# Patient Record
Sex: Male | Born: 1955
Health system: Southern US, Community
[De-identification: ages and names within clinical notes are randomized; demographics above are authoritative.]

## PROBLEM LIST (undated history)

## (undated) DIAGNOSIS — E669 Obesity, unspecified: Secondary | ICD-10-CM

## (undated) DIAGNOSIS — E785 Hyperlipidemia, unspecified: Secondary | ICD-10-CM

## (undated) DIAGNOSIS — Z8639 Personal history of other endocrine, nutritional and metabolic disease: Secondary | ICD-10-CM

## (undated) DIAGNOSIS — E119 Type 2 diabetes mellitus without complications: Secondary | ICD-10-CM

## (undated) DIAGNOSIS — F419 Anxiety disorder, unspecified: Secondary | ICD-10-CM

## (undated) DIAGNOSIS — F329 Major depressive disorder, single episode, unspecified: Secondary | ICD-10-CM

## (undated) DIAGNOSIS — G8929 Other chronic pain: Secondary | ICD-10-CM

## (undated) DIAGNOSIS — M542 Cervicalgia: Secondary | ICD-10-CM

## (undated) DIAGNOSIS — R739 Hyperglycemia, unspecified: Secondary | ICD-10-CM

## (undated) DIAGNOSIS — R51 Headache: Secondary | ICD-10-CM

## (undated) DIAGNOSIS — Z720 Tobacco use: Secondary | ICD-10-CM

## (undated) DIAGNOSIS — R0683 Snoring: Secondary | ICD-10-CM

## (undated) DIAGNOSIS — F32A Depression, unspecified: Secondary | ICD-10-CM

## (undated) DIAGNOSIS — I2510 Atherosclerotic heart disease of native coronary artery without angina pectoris: Secondary | ICD-10-CM

## (undated) DIAGNOSIS — I10 Essential (primary) hypertension: Secondary | ICD-10-CM

## (undated) DIAGNOSIS — R519 Headache, unspecified: Secondary | ICD-10-CM

## (undated) DIAGNOSIS — G4733 Obstructive sleep apnea (adult) (pediatric): Secondary | ICD-10-CM

## (undated) HISTORY — PX: CERVICAL SPINE SURGERY: SHX589

## (undated) HISTORY — DX: Hyperlipidemia, unspecified: E78.5

## (undated) HISTORY — DX: Headache: R51

## (undated) HISTORY — DX: Major depressive disorder, single episode, unspecified: F32.9

## (undated) HISTORY — DX: Snoring: R06.83

## (undated) HISTORY — DX: Personal history of other endocrine, nutritional and metabolic disease: Z86.39

## (undated) HISTORY — DX: Other chronic pain: G89.29

## (undated) HISTORY — DX: Hyperglycemia, unspecified: R73.9

## (undated) HISTORY — DX: Headache, unspecified: R51.9

## (undated) HISTORY — DX: Anxiety disorder, unspecified: F41.9

## (undated) HISTORY — DX: Tobacco use: Z72.0

## (undated) HISTORY — DX: Obstructive sleep apnea (adult) (pediatric): G47.33

## (undated) HISTORY — DX: Obesity, unspecified: E66.9

## (undated) HISTORY — DX: Depression, unspecified: F32.A

---
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
## 2004-05-13 HISTORY — PX: SHOULDER ARTHROSCOPY: SHX128

## 2005-05-13 HISTORY — PX: CERVICAL SPINE SURGERY: SHX589

## 2012-03-10 ENCOUNTER — Emergency Department (HOSPITAL_COMMUNITY)
Admission: EM | Admit: 2012-03-10 | Discharge: 2012-03-10 | Disposition: A | Discharge status: Home / Self Care | Payer: Worker's Compensation | Attending: Emergency Medicine | Admitting: Emergency Medicine

## 2012-03-10 ENCOUNTER — Emergency Department (HOSPITAL_COMMUNITY): Payer: Worker's Compensation

## 2012-03-10 ENCOUNTER — Encounter (HOSPITAL_COMMUNITY): Payer: Self-pay | Admitting: *Deleted

## 2012-03-10 DIAGNOSIS — T50995A Adverse effect of other drugs, medicaments and biological substances, initial encounter: Secondary | ICD-10-CM | POA: Insufficient documentation

## 2012-03-10 DIAGNOSIS — M542 Cervicalgia: Secondary | ICD-10-CM | POA: Insufficient documentation

## 2012-03-10 DIAGNOSIS — I499 Cardiac arrhythmia, unspecified: Secondary | ICD-10-CM | POA: Insufficient documentation

## 2012-03-10 DIAGNOSIS — F17200 Nicotine dependence, unspecified, uncomplicated: Secondary | ICD-10-CM | POA: Insufficient documentation

## 2012-03-10 DIAGNOSIS — Z79899 Other long term (current) drug therapy: Secondary | ICD-10-CM | POA: Insufficient documentation

## 2012-03-10 DIAGNOSIS — I10 Essential (primary) hypertension: Secondary | ICD-10-CM | POA: Insufficient documentation

## 2012-03-10 DIAGNOSIS — G8929 Other chronic pain: Secondary | ICD-10-CM | POA: Insufficient documentation

## 2012-03-10 DIAGNOSIS — T40605A Adverse effect of unspecified narcotics, initial encounter: Secondary | ICD-10-CM | POA: Insufficient documentation

## 2012-03-10 DIAGNOSIS — T50905A Adverse effect of unspecified drugs, medicaments and biological substances, initial encounter: Secondary | ICD-10-CM

## 2012-03-10 HISTORY — DX: Other chronic pain: G89.29

## 2012-03-10 HISTORY — DX: Essential (primary) hypertension: I10

## 2012-03-10 HISTORY — DX: Cervicalgia: M54.2

## 2012-03-10 LAB — CBC WITH DIFFERENTIAL/PLATELET
Basophils Absolute: 0.1 10*3/uL (ref 0.0–0.1)
Basophils Relative: 0 % (ref 0–1)
Eosinophils Absolute: 0.4 10*3/uL (ref 0.0–0.7)
HCT: 44 % (ref 39.0–52.0)
MCH: 30.3 pg (ref 26.0–34.0)
MCHC: 35.5 g/dL (ref 30.0–36.0)
Monocytes Absolute: 0.6 10*3/uL (ref 0.1–1.0)
Neutro Abs: 7 10*3/uL (ref 1.7–7.7)
Neutrophils Relative %: 61 % (ref 43–77)
RDW: 11.8 % (ref 11.5–15.5)

## 2012-03-10 LAB — POCT I-STAT, CHEM 8
Calcium, Ion: 1.16 mmol/L (ref 1.12–1.23)
Chloride: 101 mEq/L (ref 96–112)
Glucose, Bld: 92 mg/dL (ref 70–99)
HCT: 47 % (ref 39.0–52.0)
Hemoglobin: 16 g/dL (ref 13.0–17.0)

## 2012-03-10 MED ORDER — LORAZEPAM 1 MG PO TABS: 1.0000 mg | ORAL_TABLET | Freq: Three times a day (TID) | ORAL | Status: DC | PRN

## 2012-03-10 MED ORDER — HYDROMORPHONE HCL PF 2 MG/ML IJ SOLN
2.0000 mg | Freq: Once | INTRAMUSCULAR | Status: AC
  Administered 2012-03-10: 2 mg via INTRAMUSCULAR
  Filled 2012-03-10: qty 1

## 2012-03-10 MED ORDER — OXYCODONE HCL 5 MG PO TABS
20.0000 mg | ORAL_TABLET | Freq: Once | ORAL | Status: AC
  Administered 2012-03-10: 20 mg via ORAL
  Filled 2012-03-10: qty 4

## 2012-03-10 MED ORDER — LORAZEPAM 1 MG PO TABS
1.0000 mg | ORAL_TABLET | Freq: Once | ORAL | Status: AC
  Administered 2012-03-10: 1 mg via ORAL
  Filled 2012-03-10: qty 1

## 2012-03-10 NOTE — ED Notes (Signed)
Pt ambulated with cane upon discharge. Pt leaving with wife and instructed not to drive. Pt has no further questions after d/c teaching and give prescriptions. Pt does not appear to be in any acute distress.

## 2012-03-10 NOTE — ED Notes (Signed)
Pt returned from radiology; placed back on monitor

## 2012-03-10 NOTE — ED Provider Notes (Addendum)
History     CSN: [id number]  Arrival date & time 03/10/12  1314   First MD Initiated Contact with Patient 03/10/12 1356      Chief Complaint  Patient presents with  . Headache  . Irregular Heart Beat    (Consider location/radiation/quality/duration/timing/severity/associated sxs/prior treatment) HPI Comments: Tatian symptoms all started when he stopped his oxycodone and started Opana  Patient is a 56 y.o. male presenting with headaches. The history is provided by the patient.  Headache  This is a new problem. Episode onset: One week ago. The problem occurs constantly. The problem has been gradually worsening. The headache is associated with nothing. The pain is located in the bilateral region. The quality of the pain is described as throbbing and sharp. The pain is at a severity of 10/10. The pain is severe. The pain radiates to the left neck and right neck. Associated symptoms include palpitations and shortness of breath. Pertinent negatives include no nausea and no vomiting. Associated symptoms comments: Anxious, chills. He has tried nothing for the symptoms. The treatment provided no relief.    Past Medical History  Diagnosis Date  . Hypertension   . Chronic neck pain     Past Surgical History  Procedure Date  . Cervical spine surgery   . Shoulder arthroscopy     right    No family history on file.  History  Substance Use Topics  . Smoking status: Current Every Day Smoker    Types: Cigarettes  . Smokeless tobacco: Not on file  . Alcohol Use: No      Review of Systems  Respiratory: Positive for shortness of breath.   Cardiovascular: Positive for palpitations.  Gastrointestinal: Negative for nausea and vomiting.  Neurological: Positive for headaches.  All other systems reviewed and are negative.    Allergies  Oxymorphone  Home Medications   Current Outpatient Rx  Name Route Sig Dispense Refill  . CARISOPRODOL 350 MG PO TABS Oral Take 350 mg by mouth 3  (three) times daily as needed. For pain    . LISINOPRIL 20 MG PO TABS Oral Take 20 mg by mouth daily.    Marland Kitchen OXYMORPHONE HCL ER 40 MG PO TB12 Oral Take 40 mg by mouth every 12 (twelve) hours.    . OXYMORPHONE HCL 5 MG PO TABS Oral Take 5 mg by mouth every 4 (four) hours as needed. For pain      BP 167/97  Pulse 84  Temp 98.2 F (36.8 C) (Oral)  Resp 20  SpO2 97%  Physical Exam  Nursing note and vitals reviewed. Constitutional: He is oriented to person, place, and time. He appears well-developed and well-nourished. He appears distressed.  HENT:  Head: Normocephalic and atraumatic.  Mouth/Throat: Oropharynx is clear and moist.  Eyes: Conjunctivae normal and EOM are normal. Pupils are equal, round, and reactive to light.  Neck: Neck supple.       Patient is a soft neck brace with well-healed surgical scars of the neck. He has decreased range of motion of his neck  Cardiovascular: Normal rate, regular rhythm and intact distal pulses.   No murmur heard. Pulmonary/Chest: Effort normal and breath sounds normal. No respiratory distress. He has no wheezes. He has no rales.  Abdominal: Soft. He exhibits no distension. There is no tenderness. There is no rebound and no guarding.  Musculoskeletal: Normal range of motion. He exhibits no edema and no tenderness.  Neurological: He is alert and oriented to person, place, and time.  Skin:  Skin is warm and dry. No rash noted. No erythema.  Psychiatric: His behavior is normal. His mood appears anxious.    ED Course  Procedures (including critical care time)  Labs Reviewed  CBC WITH DIFFERENTIAL - Abnormal; Notable for the following:    WBC 11.4 (*)     All other components within normal limits  POCT I-STAT, CHEM 8   Dg Chest 2 View  03/10/2012   *RADIOLOGY REPORT*  Clinical Data: Palpitations today, some shortness of breath  CHEST - 2 VIEW  Comparison: None.  Findings: No pneumonia is seen.  Minimal scarring or atelectasis is noted at the lung  base on the lateral view possibly the left lung base.  There is mild peribronchial thickening present.  The heart is mildly enlarged. There are degenerative changes throughout the thoracic spine.  IMPRESSION: Mild cardiomegaly.  Mild peribronchial thickening.  Linear atelectasis or scarring at the lung base, possibly the left lung base.   Original Report Authenticated By: Juline Patch, M.D.      Date: 03/10/2012  Rate: 83  Rhythm: normal sinus rhythm  QRS Axis: normal  Intervals: normal  ST/T Wave abnormalities: normal  Conduction Disutrbances: none  Narrative Interpretation: unremarkable      1. Adverse drug reaction       MDM   Patient with a history of chronic pain who has been taking Opana 24 hour release and oxycodone for a prolonged period of time and then last week was changed to Opana 5 mg tablets every 4 hours when necessary instead of oxycodone. Since that time for the last week he started having dizziness, lightheadedness, palpitations, anxiety, headache, severe pain. He attempted to get a hold of his pain management doctor and has not heard any word from her.  He denies fevers however appears very distressed on exam. He satting normally with a mild elevation in his blood pressure but no evidence of tachycardia. Heal this is most likely an adverse reaction from the Opana. Patient was given IM Dilaudid and Ativan to help with his symptoms. Chest x-ray, EKG, CBC, i-STAT pending to rule out other causes for his symptoms. He has no cardiac history and his only risk factor is hypertension and smoking however this does not sound cardiac in nature.  3:08 PM No acute findings on labs and CXR.  EKG wnl.  Pt feleing much better.      Gwyneth Sprout, MD 03/10/12 1519  Gwyneth Sprout, MD 03/10/12 1521

## 2012-03-10 NOTE — ED Notes (Signed)
W. Plunkett, MD at bedside.  

## 2012-03-10 NOTE — ED Notes (Signed)
Patient states that his MD changed his medication about a week a go and he thinks he is having adverse reaction.  He was switched from oxycodone for 4 years and now he is taking something else.  Patient c/o palpitating of his chest, headache, neck pain, and states that he just does not feel well.  Respiratory intact.  Patient has a tense unit on for his neck

## 2012-08-18 ENCOUNTER — Ambulatory Visit: Payer: Self-pay | Attending: Physical Medicine and Rehabilitation

## 2012-08-18 DIAGNOSIS — IMO0001 Reserved for inherently not codable concepts without codable children: Secondary | ICD-10-CM | POA: Insufficient documentation

## 2012-08-18 DIAGNOSIS — M6281 Muscle weakness (generalized): Secondary | ICD-10-CM | POA: Insufficient documentation

## 2012-08-18 DIAGNOSIS — M542 Cervicalgia: Secondary | ICD-10-CM | POA: Insufficient documentation

## 2012-08-18 DIAGNOSIS — R5381 Other malaise: Secondary | ICD-10-CM | POA: Insufficient documentation

## 2012-08-18 DIAGNOSIS — M25519 Pain in unspecified shoulder: Secondary | ICD-10-CM | POA: Insufficient documentation

## 2012-08-24 ENCOUNTER — Ambulatory Visit: Payer: Self-pay

## 2012-08-26 ENCOUNTER — Ambulatory Visit: Payer: Self-pay | Admitting: Rehabilitation

## 2012-08-31 ENCOUNTER — Ambulatory Visit: Payer: Self-pay

## 2012-09-02 ENCOUNTER — Ambulatory Visit: Payer: Self-pay

## 2012-09-07 ENCOUNTER — Ambulatory Visit: Payer: Self-pay

## 2012-09-14 ENCOUNTER — Ambulatory Visit: Payer: Self-pay

## 2015-05-11 ENCOUNTER — Encounter: Payer: Self-pay | Admitting: Neurology

## 2015-05-11 ENCOUNTER — Ambulatory Visit (INDEPENDENT_AMBULATORY_CARE_PROVIDER_SITE_OTHER): Payer: Medicare Other | Admitting: Neurology

## 2015-05-11 VITALS — BP 180/92 | HR 82 | Resp 22 | Ht 71.75 in | Wt 262.0 lb

## 2015-05-11 DIAGNOSIS — R51 Headache: Secondary | ICD-10-CM | POA: Diagnosis not present

## 2015-05-11 DIAGNOSIS — R0683 Snoring: Secondary | ICD-10-CM

## 2015-05-11 DIAGNOSIS — E669 Obesity, unspecified: Secondary | ICD-10-CM | POA: Diagnosis not present

## 2015-05-11 DIAGNOSIS — R351 Nocturia: Secondary | ICD-10-CM | POA: Diagnosis not present

## 2015-05-11 DIAGNOSIS — R519 Headache, unspecified: Secondary | ICD-10-CM

## 2015-05-11 DIAGNOSIS — G4719 Other hypersomnia: Secondary | ICD-10-CM | POA: Diagnosis not present

## 2015-05-11 DIAGNOSIS — R0681 Apnea, not elsewhere classified: Secondary | ICD-10-CM

## 2015-05-11 NOTE — Progress Notes (Signed)
Subjective:    Patient ID: Thomas Macdonald is a 59 y.o. male.  HPI     Star Age, MD, PhD [facility] [address], [address] [place], [place] [address]  Dear Dr. Ardeth Perfect,   I saw your patient, Thomas Macdonald, upon your kind request in my neurologic clinic today for initial consultation of his sleep disorder, in particular, concern for underlying obstructive sleep apnea. The patient is accompanied by his wife today. As you know, [name] is a 59 year old right-handed gentleman with an underlying medical history of depression, anxiety, hyperlipidemia, hypertension, recurrent headaches, degenerative cervical spine disease on chronic narcotic pain medication including MS Contin and oxycodone and followed by pain management, smoking and obesity, who reports snoring and excessive daytime somnolence.  He and his wife moved from [facility] some 3 years ago. When he moved from [place], a lot of his medications were reduced. This helped with being more alert during the day. Nevertheless, he is quite sleepy during the day. He falls asleep easily. He has a sedentary lifestyle currently. Some 10 years ago he had an accident and required surgeries to his neck with hardware in place and surgery to his right rotator cuff. His bedtime varies but generally speaking he may be in bed between 11 PM and 11:30 PM. He sleeps till 10 or 11 AM every day. He does not wake up rested. He has nocturia about 3 times each night, sometimes 2 times depending on his water intake. Of note, he does not drink a lot of water and primarily drinks cherry Coke. He smokes, about 1 pack per day and has done so all his life he says. He does not appear to be motivated to quit smoking. His wife adds that his anxiety and depression have become worse. Family history is not known as he is adopted. He snores and has been witnessed to have apneic pauses while asleep. This has been witnessed also by his daughter who is a  Marine scientist. She has suspected that he has underlying OSA. He has gained weight. His blood pressure has been difficult to control. He has morning headaches fairly frequently. He is a restless sleeper. He is a light sleeper as well. Of note, he also takes gabapentin, flexeril and clonazepam, i.e. several potentially sedating medications in addition to his narcotic pain medications. I reviewed your office note from 04/24/2015, which you kindly included.  His Past Medical History Is Significant For: Past Medical History  Diagnosis Date  . Hypertension   . Chronic neck pain   . Snoring   . Headache   . Depression   . Anxiety     His Past Surgical History Is Significant For: Past Surgical History  Procedure Laterality Date  . Cervical spine surgery    . Shoulder arthroscopy      right    His Family History Is Significant For: No family history on file.  His Social History Is Significant For: Social History   Social History  . Marital Status: Married    Spouse Name: N/A  . Number of Children: N/A  . Years of Education: 14   Occupational History  . Disabled     Social History Main Topics  . Smoking status: Current Every Day Smoker    Types: Cigarettes  . Smokeless tobacco: None  . Alcohol Use: No  . Drug Use: No  . Sexual Activity: Not Asked   Other Topics Concern  . None   Social History Narrative  5 cups of soda or coffee a day     His Allergies Are:  Allergies  Allergen Reactions  . Oxymorphone Other (See Comments)    Irregular heartbeat,hotflash  :   His Current Medications Are:  Outpatient Encounter Prescriptions as of 05/11/2015  Medication Sig  . clonazePAM (KLONOPIN) 0.5 MG tablet Take 0.5 mg by mouth 2 (two) times daily as needed for anxiety.  . cyclobenzaprine (FLEXERIL) 10 MG tablet TK 1/2 T PO TID PRF PAIN  . escitalopram (LEXAPRO) 20 MG tablet Take 20 mg by mouth daily.  Marland Kitchen gabapentin (NEURONTIN) 300 MG capsule Take 300 mg by mouth 3 (three) times  daily.  Marland Kitchen lisinopril (PRINIVIL,ZESTRIL) 40 MG tablet Take 40 mg by mouth daily.  . meloxicam (MOBIC) 7.5 MG tablet Take 7.5 mg by mouth daily.  Marland Kitchen morphine (MS CONTIN) 30 MG 12 hr tablet Take 30 mg by mouth every 12 (twelve) hours.  Marland Kitchen oxyCODONE (ROXICODONE) 15 MG immediate release tablet Take 15 mg by mouth every 4 (four) hours as needed for pain.   No facility-administered encounter medications on file as of 05/11/2015.  :  Review of Systems:  Out of a complete 14 point review of systems, all are reviewed and negative with the exception of these symptoms as listed below:   Review of Systems  Constitutional: Positive for fatigue.       Weight gain   HENT: Positive for tinnitus.   Respiratory: Positive for shortness of breath and wheezing.   Cardiovascular: Positive for palpitations.  Neurological: Positive for dizziness, tremors, weakness and headaches.       Wakes up several times in the night, wife reports that patient falls asleep often, snoring, witnessed apnea, wakes up in the morning feeling tired, morning headaches, daytime tiredness.   Psychiatric/Behavioral: Positive for confusion.       Depression, anxiety, decreased energy, disinterest in activities, racing thoughts.   Epworth Sleepiness Scale 0= would never doze 1= slight chance of dozing 2= moderate chance of dozing 3= high chance of dozing  Sitting and reading:3 Watching TV:3 Sitting inactive in a public place (ex. Theater or meeting):2 As a passenger in a car for an hour without a break:1 Lying down to rest in the afternoon:3 Sitting and talking to someone:0 Sitting quietly after lunch (no alcohol):2 In a car, while stopped in traffic:0 Total:14   Objective:  Neurologic Exam  Physical Exam Physical Examination:   Filed Vitals:   05/11/15 1111  BP: 180/92  Pulse: 82  Resp: 22   General Examination: The patient is a very pleasant 59 y.o. male in no acute distress. He appears well-developed and  well-nourished and adequately groomed.   HEENT: Normocephalic, atraumatic, pupils are equal, round and reactive to light and accommodation. Funduscopic exam is normal with sharp disc margins noted. Extraocular tracking is good without limitation to gaze excursion or nystagmus noted. Normal smooth pursuit is noted. Hearing is grossly intact. Face is symmetric with normal facial animation and normal facial sensation. Speech is clear with no dysarthria noted. There is no hypophonia. There is no lip, neck/head, jaw or voice tremor. Neck is supple with decrease in passive and active motion. There are no carotid bruits on auscultation. Oropharynx exam reveals: severe mouth dryness, poor dental hygiene and marked airway crowding, due to large uvula, narrow airway entry, tonsils. Mallampati is class II. Tongue protrudes centrally and palate elevates symmetrically. Neck size is 18.25 inches. He has a mild overbite. Nasal inspection reveals no significant nasal mucosal bogginess  or redness and no septal deviation.   Chest: Clear to auscultation without wheezing, rhonchi or crackles noted.  Heart: S1+S2+0, regular and normal without murmurs, rubs or gallops noted.   Abdomen: Soft, non-tender and non-distended with normal bowel sounds appreciated on auscultation.  Extremities: There is no pitting edema in the distal lower extremities bilaterally. Pedal pulses are intact.  Skin: Warm and dry without trophic changes noted. There are no varicose veins.  Musculoskeletal: exam reveals no obvious joint deformities, tenderness or joint swelling or erythema, with the exception of decreased range of motion in the right shoulder.   Neurologically:  Mental status: The patient is awake, alert and oriented in all 4 spheres. His immediate and remote memory, attention, language skills and fund of knowledge are appropriate. There is no evidence of aphasia, agnosia, apraxia or anomia. Speech is clear with normal prosody and  enunciation. Thought process is linear. Mood is constricted and affect is blunted.  Cranial nerves II - XII are as described above under HEENT exam. In addition: shoulder shrug is normal with equal shoulder height noted. Motor exam: Normal bulk, strength and tone is noted. There is no drift, tremor or rebound. Romberg is negative. Reflexes are 1+ throughout. Babinski: Toes are flexor bilaterally. Fine motor skills and coordination: intact with normal finger taps, normal hand movements, normal rapid alternating patting, normal foot taps and normal foot agility.  Cerebellar testing: No dysmetria or intention tremor on finger to nose testing. Heel to shin is unremarkable bilaterally. There is no truncal or gait ataxia.  Sensory exam: intact to light touch, pinprick, vibration, temperature sense in the upper and lower extremities, with the exception of decreased pinprick and temperature sensation in the right forearm.  Gait, station and balance: He stands with difficulty. No veering to one side is noted. No leaning to one side is noted. Posture is age-appropriate and stance is narrow based. Gait shows normal stride length and normal pace. No problems turning are noted. He turns en bloc.   Assessment and Plan:   In summary, Adriann Friedli is a very pleasant 59 y.o.-year old male with an underlying medical history of depression, anxiety, hyperlipidemia, hypertension, recurrent headaches, degenerative cervical spine disease on chronic narcotic pain medication including MS Contin and oxycodone and followed by pain management, smoking and obesity, whose history and physical exam are in keeping with obstructive sleep apnea (OSA). I had a long chat with the patient and his wife about my findings and the diagnosis of OSA, its prognosis and treatment options. We talked about medical treatments, surgical interventions and non-pharmacological approaches. I explained in particular the risks and ramifications of untreated  moderate to severe OSA, especially with respect to developing cardiovascular disease down the Road, including congestive heart failure, difficult to treat hypertension, cardiac arrhythmias, or stroke. Even type 2 diabetes has, in part, been linked to untreated OSA. Symptoms of untreated OSA include daytime sleepiness, memory problems, mood irritability and mood disorder such as depression and anxiety, lack of energy, as well as recurrent headaches, especially morning headaches. We talked about smoking cessation and trying to maintain a healthy lifestyle in general, as well as the importance of weight control. I encouraged the patient to eat healthy, exercise daily and keep well hydrated, to keep a scheduled bedtime and wake time routine, to not skip any meals and eat healthy snacks in between meals. I advised the patient not to drive when feeling sleepy. He is advised to reduce his caffeine intake and increase his water intake.  I recommended the following at this time: sleep study with potential positive airway pressure titration. (We will score hypopneas at 3% and split the sleep study into diagnostic and treatment portion, if the estimated. 2 hour AHI is >15/h).   I explained the sleep test procedure to the patient and also outlined possible surgical and non-surgical treatment options of OSA, including the use of a custom-made dental device (which would require a referral to a specialist dentist or oral surgeon), upper airway surgical options, such as pillar implants, radiofrequency surgery, tongue base surgery, and UPPP (which would involve a referral to an ENT surgeon). Rarely, jaw surgery such as mandibular advancement may be considered.  I also explained the CPAP treatment option to the patient, who indicated that he would be willing to try CPAP if the need arises. I explained the importance of being compliant with PAP treatment, not only for insurance purposes but primarily to improve His symptoms, and  for the patient's long term health benefit, including to reduce His cardiovascular risks. I answered all their questions today and the patient and his wife were in agreement. I would like to see him back after the sleep study is completed and encouraged him to call with any interim questions, concerns, problems or updates.   Thank you very much for allowing me to participate in the care of this nice patient. If I can be of any further assistance to you please do not hesitate to call me at [phone].  Sincerely,   Star Age, MD, PhD

## 2015-05-11 NOTE — Patient Instructions (Signed)

## 2015-06-05 ENCOUNTER — Ambulatory Visit (INDEPENDENT_AMBULATORY_CARE_PROVIDER_SITE_OTHER): Payer: Medicare Other | Admitting: Neurology

## 2015-06-05 DIAGNOSIS — R9431 Abnormal electrocardiogram [ECG] [EKG]: Secondary | ICD-10-CM

## 2015-06-05 DIAGNOSIS — G479 Sleep disorder, unspecified: Secondary | ICD-10-CM

## 2015-06-05 DIAGNOSIS — G4733 Obstructive sleep apnea (adult) (pediatric): Secondary | ICD-10-CM | POA: Diagnosis not present

## 2015-06-05 DIAGNOSIS — G4720 Circadian rhythm sleep disorder, unspecified type: Secondary | ICD-10-CM

## 2015-06-06 MED FILL — oxyCODONE HCL 10 MG TABS: 10 | 30 days supply | Qty: 180 | Fill #0

## 2015-06-06 MED FILL — MORPHINE SULFATE ER 30 MG C: 30 | 30 days supply | Qty: 60 | Fill #0

## 2015-06-06 NOTE — Sleep Study (Signed)
Please see the scanned sleep study interpretation located in the Procedure tab within the Chart Review section. 

## 2015-06-08 ENCOUNTER — Telehealth: Payer: Self-pay | Admitting: Neurology

## 2015-06-08 DIAGNOSIS — G4733 Obstructive sleep apnea (adult) (pediatric): Secondary | ICD-10-CM

## 2015-06-08 NOTE — Telephone Encounter (Signed)
I spoke to patient and he is aware of results and recommendations. He reports that he felt better the next day after CPAP study. I will send orders to DME company. I will send report to PCP. And I will send the patient a letter reminding him to make appt and stress the importance of compliance.

## 2015-06-08 NOTE — Telephone Encounter (Signed)
Diana:  Patient referred by Dr. Ardeth Perfect, seen by me on 05/11/15, split study on 06/05/15, Ins: [employer] MCR. Please call and notify patient that the recent sleep study confirmed the diagnosis of severe OSA. He did adequately well with CPAP during the study with significant improvement of the respiratory events. Therefore, I would like start the patient on CPAP therapy at home by prescribing a machine for home use. I placed the order in the chart. The patient will need a follow up appointment with me in 8 to 10 weeks post set up that has to be scheduled; please go ahead and schedule while you have the patient on the phone and make sure patient understands the importance of keeping this window for the FU appointment, as it is often an insurance requirement and failing to adhere to this may result in losing coverage for sleep apnea treatment.  Please re-enforce the importance of compliance with treatment and the need for [place] to monitor compliance data - again an insurance requirement and good feedback for the patient as far as how they are doing.  Also remind patient, that any upcoming CPAP machine or mask issues, should be first addressed with the DME company. Please ask if patient has a preference regarding DME company.  Please arrange for CPAP set up at home through a DME company of patient's choice - once you have spoken to the patient - and faxed/routed report to PCP and referring MD (if other than PCP), you can close this encounter, thanks,   Star Age, MD, PhD [facility] ([place])

## 2015-06-14 DIAGNOSIS — G4733 Obstructive sleep apnea (adult) (pediatric): Secondary | ICD-10-CM | POA: Diagnosis not present

## 2015-06-29 ENCOUNTER — Telehealth: Payer: Self-pay | Admitting: Neurology

## 2015-06-29 NOTE — Telephone Encounter (Signed)
I spoke to patient and advised him that Thomas Macdonald will call him in the morning. She will assess if he needs a new mask or just a liner. He voiced understanding and will wait for call.

## 2015-06-29 NOTE — Telephone Encounter (Signed)
Patient's wife called to say the patient has a rash caused by the CPAP mask.  They called the DME company([employer]) and they told him to use gauze and he is away it will choke him during the night.  Wants to see if you have any suggestions.  Please call patient.

## 2015-06-30 ENCOUNTER — Telehealth: Payer: Self-pay

## 2015-06-30 NOTE — Telephone Encounter (Signed)
Patient came in to sleep lab because he is having a break out with his mask. He is wearing F&P simplus medium. I switched him to the Respironics Amara gel mask.Size medium and large. He will let me know if the gel works better. He is very sensitive skin and may need to order mask liners. I told him these could be ordered from [email or web address]  He will let me know how this works,

## 2015-07-03 NOTE — Telephone Encounter (Signed)
Thank you very much 

## 2015-07-05 MED FILL — oxyCODONE HCL 10 MG TABS: 10 | 30 days supply | Qty: 180 | Fill #0

## 2015-07-05 MED FILL — MORPHINE SULFATE ER 30 MG C: 30 | 30 days supply | Qty: 60 | Fill #0

## 2015-07-12 DIAGNOSIS — G4733 Obstructive sleep apnea (adult) (pediatric): Secondary | ICD-10-CM | POA: Diagnosis not present

## 2015-07-12 DIAGNOSIS — I10 Essential (primary) hypertension: Secondary | ICD-10-CM | POA: Diagnosis not present

## 2015-07-12 DIAGNOSIS — Z125 Encounter for screening for malignant neoplasm of prostate: Secondary | ICD-10-CM | POA: Diagnosis not present

## 2015-07-12 DIAGNOSIS — E784 Other hyperlipidemia: Secondary | ICD-10-CM | POA: Diagnosis not present

## 2015-07-19 DIAGNOSIS — I10 Essential (primary) hypertension: Secondary | ICD-10-CM | POA: Diagnosis not present

## 2015-07-19 DIAGNOSIS — F17200 Nicotine dependence, unspecified, uncomplicated: Secondary | ICD-10-CM | POA: Diagnosis not present

## 2015-07-19 DIAGNOSIS — Z0000 Encounter for general adult medical examination without abnormal findings: Secondary | ICD-10-CM | POA: Diagnosis not present

## 2015-07-19 DIAGNOSIS — G4733 Obstructive sleep apnea (adult) (pediatric): Secondary | ICD-10-CM | POA: Diagnosis not present

## 2015-07-19 DIAGNOSIS — E784 Other hyperlipidemia: Secondary | ICD-10-CM | POA: Diagnosis not present

## 2015-07-19 DIAGNOSIS — R5383 Other fatigue: Secondary | ICD-10-CM | POA: Diagnosis not present

## 2015-07-19 DIAGNOSIS — E291 Testicular hypofunction: Secondary | ICD-10-CM | POA: Diagnosis not present

## 2015-07-19 DIAGNOSIS — Z6836 Body mass index (BMI) 36.0-36.9, adult: Secondary | ICD-10-CM | POA: Diagnosis not present

## 2015-07-19 DIAGNOSIS — F330 Major depressive disorder, recurrent, mild: Secondary | ICD-10-CM | POA: Diagnosis not present

## 2015-07-19 DIAGNOSIS — R739 Hyperglycemia, unspecified: Secondary | ICD-10-CM | POA: Diagnosis not present

## 2015-08-02 MED FILL — MORPHINE SULFATE ER 30 MG C: 30 | 30 days supply | Qty: 60 | Fill #0

## 2015-08-02 MED FILL — oxyCODONE HCL 10 MG TABS: 10 | 30 days supply | Qty: 180 | Fill #0

## 2015-08-04 DIAGNOSIS — Z1211 Encounter for screening for malignant neoplasm of colon: Secondary | ICD-10-CM | POA: Diagnosis not present

## 2015-08-04 DIAGNOSIS — Z1212 Encounter for screening for malignant neoplasm of rectum: Secondary | ICD-10-CM | POA: Diagnosis not present

## 2015-08-12 DIAGNOSIS — G4733 Obstructive sleep apnea (adult) (pediatric): Secondary | ICD-10-CM | POA: Diagnosis not present

## 2015-08-17 LAB — COLOGUARD

## 2015-08-21 ENCOUNTER — Encounter: Payer: Self-pay | Admitting: Gastroenterology

## 2015-09-01 DIAGNOSIS — F5221 Male erectile disorder: Secondary | ICD-10-CM | POA: Diagnosis not present

## 2015-09-01 DIAGNOSIS — G4733 Obstructive sleep apnea (adult) (pediatric): Secondary | ICD-10-CM | POA: Diagnosis not present

## 2015-09-01 DIAGNOSIS — F330 Major depressive disorder, recurrent, mild: Secondary | ICD-10-CM | POA: Diagnosis not present

## 2015-09-01 DIAGNOSIS — R195 Other fecal abnormalities: Secondary | ICD-10-CM | POA: Diagnosis not present

## 2015-09-01 DIAGNOSIS — Z6836 Body mass index (BMI) 36.0-36.9, adult: Secondary | ICD-10-CM | POA: Diagnosis not present

## 2015-09-01 DIAGNOSIS — R5383 Other fatigue: Secondary | ICD-10-CM | POA: Diagnosis not present

## 2015-09-01 DIAGNOSIS — E291 Testicular hypofunction: Secondary | ICD-10-CM | POA: Diagnosis not present

## 2015-09-01 MED FILL — oxyCODONE HCL 10 MG TABS: 10 | 30 days supply | Qty: 180 | Fill #0

## 2015-09-01 MED FILL — MORPHINE SULFATE ER 30 MG C: 30 | 30 days supply | Qty: 60 | Fill #0

## 2015-09-08 DIAGNOSIS — Z6836 Body mass index (BMI) 36.0-36.9, adult: Secondary | ICD-10-CM | POA: Diagnosis not present

## 2015-09-08 DIAGNOSIS — G8921 Chronic pain due to trauma: Secondary | ICD-10-CM | POA: Diagnosis not present

## 2015-09-08 DIAGNOSIS — F331 Major depressive disorder, recurrent, moderate: Secondary | ICD-10-CM | POA: Diagnosis not present

## 2015-09-08 DIAGNOSIS — F419 Anxiety disorder, unspecified: Secondary | ICD-10-CM | POA: Diagnosis not present

## 2015-09-08 DIAGNOSIS — I10 Essential (primary) hypertension: Secondary | ICD-10-CM | POA: Diagnosis not present

## 2015-09-29 MED FILL — MORPHINE SULFATE ER 30 MG C: 30 | 30 days supply | Qty: 60 | Fill #0

## 2015-09-29 MED FILL — oxyCODONE HCL 10 MG TABS: 10 | 30 days supply | Qty: 180 | Fill #0

## 2015-10-17 ENCOUNTER — Ambulatory Visit (INDEPENDENT_AMBULATORY_CARE_PROVIDER_SITE_OTHER): Payer: Medicare Other | Admitting: Gastroenterology

## 2015-10-17 ENCOUNTER — Encounter: Payer: Self-pay | Admitting: Gastroenterology

## 2015-10-17 ENCOUNTER — Other Ambulatory Visit (INDEPENDENT_AMBULATORY_CARE_PROVIDER_SITE_OTHER): Payer: Medicare Other

## 2015-10-17 VITALS — BP 160/100 | HR 84 | Ht 71.25 in | Wt 270.4 lb

## 2015-10-17 DIAGNOSIS — R740 Nonspecific elevation of levels of transaminase and lactic acid dehydrogenase [LDH]: Secondary | ICD-10-CM

## 2015-10-17 DIAGNOSIS — Z1211 Encounter for screening for malignant neoplasm of colon: Secondary | ICD-10-CM

## 2015-10-17 DIAGNOSIS — K5900 Constipation, unspecified: Secondary | ICD-10-CM

## 2015-10-17 DIAGNOSIS — R195 Other fecal abnormalities: Secondary | ICD-10-CM | POA: Diagnosis not present

## 2015-10-17 DIAGNOSIS — R7401 Elevation of levels of liver transaminase levels: Secondary | ICD-10-CM

## 2015-10-17 LAB — HEPATIC FUNCTION PANEL
ALT: 35 U/L (ref 0–53)
AST: 18 U/L (ref 0–37)
Albumin: 4.4 g/dL (ref 3.5–5.2)
Alkaline Phosphatase: 78 U/L (ref 39–117)
BILIRUBIN DIRECT: 0.1 mg/dL (ref 0.0–0.3)
BILIRUBIN TOTAL: 0.4 mg/dL (ref 0.2–1.2)
Total Protein: 7.1 g/dL (ref 6.0–8.3)

## 2015-10-17 MED ORDER — NA SULFATE-K SULFATE-MG SULF 17.5-3.13-1.6 GM/177ML PO SOLN: ORAL | Status: DC

## 2015-10-17 NOTE — Patient Instructions (Signed)
You have been scheduled for a colonoscopy. Please follow written instructions given to you at your visit today.  Please pick up your prep supplies at the pharmacy within the next 1-3 days. If you use inhalers (even only as needed), please bring them with you on the day of your procedure. Your physician has requested that you go to www.startemmi.com and enter the access code given to you at your visit today. This web site gives a general overview about your procedure. However, you should still follow specific instructions given to you by our office regarding your preparation for the procedure.  Please purchase the following medications over the counter and take as directed: Miralax 17 grams (1 capful) dissolved in at least 8 ounces water/juice once daily  Your physician has requested that you go to the basement for the following lab work before leaving today: Hepatic function, Hepatitis B surface antigen, Hepatitis C antibody  If you are age 47 or older, your body mass index should be between 23-30. Your Body mass index is 37.44 kg/(m^2). If this is out of the aforementioned range listed, please consider follow up with your Primary Care Provider.  If you are age 63 or younger, your body mass index should be between 19-25. Your Body mass index is 37.44 kg/(m^2). If this is out of the aformentioned range listed, please consider follow up with your Primary Care Provider.

## 2015-10-17 NOTE — Progress Notes (Signed)
HPI :  60 y/o male with chronic joint pains on chronic narcotics, presenting for evaluation for (+) Cologuard and changes in bowels.  He had a positive Cologuard on 08/27/2015 . He reports he had a remote colonoscopy about 10 years ago and states it was a normal exam.  He reports some altered bowel habits over the past several years. He thinks over time due to medications he was taking, chronic narcotics. He reports over the last month, he had one BM per day, and passes small hard stools. He denies any blood in the stools. He reports he has been trying to eat more fiber which he thinks helps. He has some lower abdominal cramps which can come and go. He reports it has been there for the past month. If he does not have a bowel movement it gets worse. If he has a bowel movement it makes it better temporarily. No nausea or vomiting. He thinks he has gained some weight.   He has chronic joint pains and migraine headaches. He takes chronic narcotics. He is a chronic tobacco user his "whole life". No FH of colon cancer known, adopted.   He denies any history of liver disease. No alcohol use.   08/18/15 - Hgb 16.4, plt 271,  ALT 47, AST 20, AP 103, T bil 0.4   Past Medical History  Diagnosis Date  . Hypertension   . Chronic neck pain   . Snoring   . Headache   . Depression   . Anxiety   . Obesity   . HLD (hyperlipidemia)   . H/O hypogonadism   . Tobacco abuse   . Hyperglycemia   . OSA (obstructive sleep apnea)     CPAP  . Chronic headaches      Past Surgical History  Procedure Laterality Date  . Cervical spine surgery  2007  . Shoulder arthroscopy Right 2006   Family History  Problem Relation Age of Onset  . Adopted: Yes   Social History  Substance Use Topics  . Smoking status: Current Every Day Smoker    Types: Cigarettes  . Smokeless tobacco: Never Used  . Alcohol Use: No   Current Outpatient Prescriptions  Medication Sig Dispense Refill  . clonazePAM (KLONOPIN) 0.5 MG  tablet Take 0.5 mg by mouth 2 (two) times daily as needed for anxiety.    . hydrochlorothiazide (HYDRODIURIL) 25 MG tablet Take 1 tablet by mouth as needed.  6  . lisinopril (PRINIVIL,ZESTRIL) 40 MG tablet Take 40 mg by mouth daily.    Marland Kitchen morphine (MS CONTIN) 30 MG 12 hr tablet Take 30 mg by mouth every 12 (twelve) hours.    Marland Kitchen oxyCODONE (ROXICODONE) 15 MG immediate release tablet Take 15 mg by mouth every 4 (four) hours as needed for pain.    Marland Kitchen testosterone cypionate (DEPOTESTOSTERONE CYPIONATE) 200 MG/ML injection Inject 200 mg into the muscle every 21 ( twenty-one) days.  5   No current facility-administered medications for this visit.   Allergies  Allergen Reactions  . Oxymorphone Other (See Comments)    Irregular heartbeat,hotflash     Review of Systems: All systems reviewed and negative except where noted in HPI.   Recent labs per HPI above  Physical Exam: BP 160/100 mmHg  Pulse 84  Ht 5' 11.25" (1.81 m)  Wt 270 lb 6 oz (122.641 kg)  BMI 37.44 kg/m2 Constitutional: Pleasant,well-developed, male in no acute distress. HEENT: Normocephalic and atraumatic. Conjunctivae are normal. No scleral icterus. Neck supple.  Cardiovascular: Normal  rate, regular rhythm.  Pulmonary/chest: Effort normal and breath sounds normal. No wheezing, rales or rhonchi. Abdominal: Soft, nondistended, protuberant, nontender. Bowel sounds active throughout. There are no masses palpable. No hepatomegaly. Extremities: no edema Lymphadenopathy: No cervical adenopathy noted. Neurological: Alert and oriented to person place and time. Skin: Skin is warm and dry. No rashes noted. Psychiatric: Normal mood and affect. Behavior is normal.   ASSESSMENT AND PLAN: 60 y/o male seen in consultation to discuss the following issues:  (+) Cologuard - I discussed what this test is and implication of a positive test. Colonoscopy is recommended to evaluate and remove polyps if present. I discussed the risks / benefits of  colonoscopy with him and he wished to proceed. Further recommendations pending the results.   Constipation / abdominal pain - likely due to chronic narcotics, worsening recently. I suspect also contributing to his pain as he describes it above. Recommend miralax daily to see if this helps. Await colonoscopy results.   ALT elevation - mild ALT elevation noted on routine labs. Will repeat with hep C screening. If remains elevated will need further workup with additional labs and [place]. He agreed.   Of note, patient needs to check BP at home and if remains elevated persistently should follow up with Dr. Ardeth Perfect for reassessment. Asymptomatic today from HTN.  Arcola Cellar, MD [facility] Pager [phone]  CC: Velna Hatchet, MD

## 2015-10-18 ENCOUNTER — Other Ambulatory Visit: Payer: Self-pay | Admitting: *Deleted

## 2015-10-18 DIAGNOSIS — R740 Nonspecific elevation of levels of transaminase and lactic acid dehydrogenase [LDH]: Principal | ICD-10-CM

## 2015-10-18 DIAGNOSIS — R7401 Elevation of levels of liver transaminase levels: Secondary | ICD-10-CM

## 2015-10-18 LAB — HEPATITIS C ANTIBODY: HCV Ab: NEGATIVE

## 2015-10-18 LAB — HEPATITIS B SURFACE ANTIGEN: Hepatitis B Surface Ag: NEGATIVE

## 2015-10-30 ENCOUNTER — Telehealth: Payer: Self-pay | Admitting: Gastroenterology

## 2015-10-30 MED FILL — oxyCODONE HCL 10 MG TABS: 10 | 30 days supply | Qty: 180 | Fill #0

## 2015-10-30 MED FILL — MORPHINE SULFATE ER 30 MG C: 30 | 30 days supply | Qty: 60 | Fill #0

## 2015-11-01 NOTE — Telephone Encounter (Signed)
Told patient that I would call him closer to his appointment and let him know he could come pick up a Suprep sample.  Patient agreed.

## 2015-11-06 ENCOUNTER — Telehealth: Payer: Self-pay

## 2015-11-06 ENCOUNTER — Other Ambulatory Visit: Payer: Self-pay | Admitting: *Deleted

## 2015-11-06 ENCOUNTER — Telehealth: Payer: Self-pay | Admitting: Gastroenterology

## 2015-11-06 NOTE — Telephone Encounter (Signed)
Spoke with patient and told him I had not forgotten he needed a prep and would call him a little closer to his procedure.  Patient agreed

## 2015-11-06 NOTE — Telephone Encounter (Signed)
Spoke with patient's wife and she states he is having lower abdominal pain like he has off and on. Denies fever, nausea or vomiting. States he is constipated also. He is using Algin but not helping. He has not been taking Miralax for constipation. Suggested he try taking up to three doses/day for constipation to see if this helps. He will call us if this does not help or if symptoms worsen.

## 2015-11-06 NOTE — Telephone Encounter (Signed)
-----   Message from Hulan Saas, RN sent at 11/06/2015 10:58 AM EDT ----- This patient is scheduled for colonoscopy on 11/20/15. His wife called today about getting his prep sample.(she talked to you before)  She is worried about getting it. Rollene Fare

## 2015-11-08 DIAGNOSIS — L738 Other specified follicular disorders: Secondary | ICD-10-CM | POA: Diagnosis not present

## 2015-11-08 DIAGNOSIS — D485 Neoplasm of uncertain behavior of skin: Secondary | ICD-10-CM | POA: Diagnosis not present

## 2015-11-08 DIAGNOSIS — L918 Other hypertrophic disorders of the skin: Secondary | ICD-10-CM | POA: Diagnosis not present

## 2015-11-08 DIAGNOSIS — D225 Melanocytic nevi of trunk: Secondary | ICD-10-CM | POA: Diagnosis not present

## 2015-11-08 DIAGNOSIS — L718 Other rosacea: Secondary | ICD-10-CM | POA: Diagnosis not present

## 2015-11-08 DIAGNOSIS — L820 Inflamed seborrheic keratosis: Secondary | ICD-10-CM | POA: Diagnosis not present

## 2015-11-15 NOTE — Telephone Encounter (Signed)
Patient's wife called about prep kit. States she is feeling nervous that procedure is so close and they have not heard anything.

## 2015-11-20 ENCOUNTER — Encounter: Payer: Self-pay | Admitting: Gastroenterology

## 2015-11-20 ENCOUNTER — Ambulatory Visit (AMBULATORY_SURGERY_CENTER): Payer: Medicare Other | Admitting: Gastroenterology

## 2015-11-20 VITALS — BP 136/88 | HR 72 | Temp 98.0°F | Resp 16 | Ht 71.0 in | Wt 270.0 lb

## 2015-11-20 DIAGNOSIS — D129 Benign neoplasm of anus and anal canal: Secondary | ICD-10-CM

## 2015-11-20 DIAGNOSIS — D128 Benign neoplasm of rectum: Secondary | ICD-10-CM

## 2015-11-20 DIAGNOSIS — D124 Benign neoplasm of descending colon: Secondary | ICD-10-CM

## 2015-11-20 DIAGNOSIS — R195 Other fecal abnormalities: Secondary | ICD-10-CM

## 2015-11-20 DIAGNOSIS — G4730 Sleep apnea, unspecified: Secondary | ICD-10-CM | POA: Diagnosis not present

## 2015-11-20 DIAGNOSIS — D123 Benign neoplasm of transverse colon: Secondary | ICD-10-CM

## 2015-11-20 DIAGNOSIS — K621 Rectal polyp: Secondary | ICD-10-CM

## 2015-11-20 DIAGNOSIS — D125 Benign neoplasm of sigmoid colon: Secondary | ICD-10-CM

## 2015-11-20 MED ORDER — SODIUM CHLORIDE 0.9 % IV SOLN: 500.0000 mL | INTRAVENOUS | Status: DC

## 2015-11-20 MED ORDER — DICYCLOMINE HCL 20 MG PO TABS: 20.0000 mg | ORAL_TABLET | Freq: Three times a day (TID) | ORAL | Status: DC | PRN

## 2015-11-20 NOTE — Op Note (Signed)
[name]: Thomas Macdonald Procedure Date: 11/20/2015 2:17 PM MRN: [id number] Endoscopist: Remo Lipps P. Havery Moros , MD Age: 60 Referring MD:  Date of Birth: [birth year] Gender: Male Account #: [id number] Procedure:                Colonoscopy Indications:              Positive Cologuard test Medicines:                Monitored Anesthesia Care Procedure:                Pre-Anesthesia Assessment:                           - Prior to the procedure, a History and Physical                            was performed, and patient medications and                            allergies were reviewed. The patient's tolerance of                            previous anesthesia was also reviewed. The risks                            and benefits of the procedure and the sedation                            options and risks were discussed with the patient.                            All questions were answered, and informed consent                            was obtained. Prior Anticoagulants: The patient has                            taken no previous anticoagulant or antiplatelet                            agents. ASA Grade Assessment: III - A patient with                            severe systemic disease. After reviewing the risks                            and benefits, the patient was deemed in                            satisfactory condition to undergo the procedure.                           After obtaining informed consent, the colonoscope  was passed under direct vision. Throughout the                            procedure, the patient's blood pressure, pulse, and                            oxygen saturations were monitored continuously. The                            Model CF-HQ190L [id number]) scope was introduced                            through the anus and advanced to the the cecum,                            identified by appendiceal  orifice and ileocecal                            valve. The colonoscopy was performed without                            difficulty. The patient tolerated the procedure                            well. The quality of the bowel preparation was                            adequate. The ileocecal valve, appendiceal orifice,                            and rectum were photographed. Scope In: 2:30:12 PM Scope Out: 3:06:05 PM Scope Withdrawal Time: 0 hours 30 minutes 5 seconds  Total Procedure Duration: 0 hours 35 minutes 53 seconds  Findings:                 The perianal and digital rectal examinations were                            normal.                           A 5 mm polyp was found in the hepatic flexure. The                            polyp was sessile. The polyp was removed with a                            cold snare. Resection and retrieval were complete.                           A 10 mm polyp was found in the transverse colon.                            The polyp was flat. The polyp was removed  with a                            cold snare. Resection and retrieval were complete.                           Two sessile polyps were found in the descending                            colon. The polyps were 4 to 5 mm in size. These                            polyps were removed with a cold snare. Resection                            and retrieval were complete.                           Multiple flat and sessile polyps were noted in the                            rectum and sigmoid colon, grossly consistent with                            hyperplastic polyps. A representative 3 mm polyp                            was removed with a cold biopsy forceps. Resection                            and retrieval were complete.                           Scattered small-mouthed diverticula were found in                            the left colon.                           Non-bleeding internal  hemorrhoids were found during                            retroflexion.                           The exam was otherwise without abnormality. The                            exam was prolonged as time was needed to the lavage                            the colon to obtain adequate views, and there was                            poor  air retention in the left colon. Complications:            No immediate complications. Estimated blood loss:                            Minimal. Estimated Blood Loss:     Estimated blood loss was minimal. Impression:               - One 5 mm polyp at the hepatic flexure, removed                            with a cold snare. Resected and retrieved.                           - One 10 mm polyp in the transverse colon, removed                            with a cold snare. Resected and retrieved.                           - Two 4 to 5 mm polyps in the descending colon,                            removed with a cold snare. Resected and retrieved.                           - Multiple suspected lower colon hyperplastic                            (benign) polyps, one representative 3 mm polyp                            removed with a cold biopsy forceps. Resected and                            retrieved.                           - Diverticulosis in the left colon.                           - Non-bleeding internal hemorrhoids.                           - The examination was otherwise normal, prolonged                            due to time needed to lavage the colon and poor air                            retention in the left colon. Recommendation:           - Patient has a contact number available for  emergencies. The signs and symptoms of potential                            delayed complications were discussed with the                            patient. Return to normal activities tomorrow.                            Written discharge  instructions were provided to the                            patient.                           - Resume previous diet.                           - Continue present medications.                           - No aspirin, ibuprofen, naproxen, or other                            non-steroidal anti-inflammatory drugs for 2 weeks                            after polyp removal.                           - Await pathology results.                           - Repeat colonoscopy is recommended for                            surveillance. The colonoscopy date will be                            determined after pathology results from today's                            exam become available for review. Remo Lipps P. Elgene Coral, MD 11/20/2015 3:13:53 PM This report has been signed electronically.

## 2015-11-20 NOTE — Progress Notes (Signed)
Called to room to assist during endoscopic procedure.  Patient ID and intended procedure confirmed with present staff. Received instructions for my participation in the procedure from the performing physician.  

## 2015-11-20 NOTE — Progress Notes (Signed)
A/ox3, pleased with MAC, report to RN 

## 2015-11-20 NOTE — Patient Instructions (Addendum)
YOU HAD AN ENDOSCOPIC PROCEDURE TODAY AT THE [facility]:   Refer to the procedure report that was given to you for any specific questions about what was found during the examination.  If the procedure report does not answer your questions, please call your gastroenterologist to clarify.  If you requested that your care partner not be given the details of your procedure findings, then the procedure report has been included in a sealed envelope for you to review at your convenience later.  YOU SHOULD EXPECT: Some feelings of bloating in the abdomen. Passage of more gas than usual.  Walking can help get rid of the air that was put into your GI tract during the procedure and reduce the bloating. If you had a lower endoscopy (such as a colonoscopy or flexible sigmoidoscopy) you may notice spotting of blood in your stool or on the toilet paper. If you underwent a bowel prep for your procedure, you may not have a normal bowel movement for a few days.  Please Note:  You might notice some irritation and congestion in your nose or some drainage.  This is from the oxygen used during your procedure.  There is no need for concern and it should clear up in a day or so.  SYMPTOMS TO REPORT IMMEDIATELY:   Following lower endoscopy (colonoscopy or flexible sigmoidoscopy):  Excessive amounts of blood in the stool  Significant tenderness or worsening of abdominal pains  Swelling of the abdomen that is new, acute  Fever of 100F or higher   Following upper endoscopy (EGD)  Vomiting of blood or coffee ground material  New chest pain or pain under the shoulder blades  Painful or persistently difficult swallowing  New shortness of breath  Fever of 100F or higher  Black, tarry-looking stools  For urgent or emergent issues, a gastroenterologist can be reached at any hour by calling [phone].   DIET: Your first meal following the procedure should be a small meal and then it is ok to progress to  your normal diet. Heavy or fried foods are harder to digest and may make you feel nauseous or bloated.  Likewise, meals heavy in dairy and vegetables can increase bloating.  Drink plenty of fluids but you should avoid alcoholic beverages for 24 hours.  ACTIVITY:  You should plan to take it easy for the rest of today and you should NOT DRIVE or use heavy machinery until tomorrow (because of the sedation medicines used during the test).    FOLLOW UP: Our staff will call the number listed on your records the next business day following your procedure to check on you and address any questions or concerns that you may have regarding the information given to you following your procedure. If we do not reach you, we will leave a message.  However, if you are feeling well and you are not experiencing any problems, there is no need to return our call.  We will assume that you have returned to your regular daily activities without incident.  If any biopsies were taken you will be contacted by phone or by letter within the next 1-3 weeks.  Please call us at [phone] if you have not heard about the biopsies in 3 weeks.    SIGNATURES/CONFIDENTIALITY: You and/or your care partner have signed paperwork which will be entered into your electronic medical record.  These signatures attest to the fact that that the information above on your After Visit Summary has been reviewed   and is understood.  Full responsibility of the confidentiality of this discharge information lies with you and/or your care-partner.  Bentyl 20 mg every 8 hours as needed. Please review polyp, diverticulosis, high fiber diet, and hemorrhoid handouts provided. No aspirin, ibuprofen, naproxen, aleve, or other non-steroidal anti-inflammatory drugs for 2 weeks after polyp removal.

## 2015-11-21 ENCOUNTER — Telehealth: Payer: Self-pay

## 2015-11-21 NOTE — Telephone Encounter (Signed)
  Follow up Call-  Call back number 11/20/2015  Post procedure Call Back phone  # [phone]  Permission to leave phone message Yes     Patient questions:  Do you have a fever, pain , or abdominal swelling? No. Pain Score  0 *  Have you tolerated food without any problems? Yes.    Have you been able to return to your normal activities? Yes.    Do you have any questions about your discharge instructions: Diet   No. Medications  No. Follow up visit  No.  Do you have questions or concerns about your Care? No.  Actions: * If pain score is 4 or above: No action needed, pain <4.

## 2015-11-24 ENCOUNTER — Encounter: Payer: Self-pay | Admitting: Gastroenterology

## 2015-11-27 MED FILL — MORPHINE SULFATE ER 30 MG C: 30 | 30 days supply | Qty: 60 | Fill #0

## 2015-11-27 MED FILL — oxyCODONE HCL 10 MG TABS: 10 | 30 days supply | Qty: 180 | Fill #0

## 2015-12-06 ENCOUNTER — Telehealth: Payer: Self-pay | Admitting: Gastroenterology

## 2015-12-06 NOTE — Telephone Encounter (Signed)
Left a message for patient to call back. 

## 2015-12-06 NOTE — Telephone Encounter (Signed)
Spoke with patient and his wife. He reports lower abdominal pain below belly button. DIcyclomine helps some but not much. He reports constipation and only having a bowel movement every other day.He takes pain medication routinely. Stools are hard. He will try Miralax 1 cap full daily to see if this helps. He will call back if no improvement.

## 2015-12-27 MED FILL — oxyCODONE HCL 10 MG TABS: 10 | 30 days supply | Qty: 180 | Fill #0

## 2015-12-27 MED FILL — MORPHINE SULFATE ER 30 MG C: 30 | 30 days supply | Qty: 60 | Fill #0

## 2016-01-24 MED FILL — MORPHINE SULFATE ER 30 MG C: 30 | 30 days supply | Qty: 60 | Fill #0

## 2016-01-24 MED FILL — oxyCODONE HCL 10 MG TABS: 10 | 30 days supply | Qty: 180 | Fill #0

## 2016-02-23 MED FILL — oxyCODONE HCL 10 MG TABS: 10 | 30 days supply | Qty: 180 | Fill #0

## 2016-02-23 MED FILL — MORPHINE SULFATE ER 30 MG C: 30 | 30 days supply | Qty: 60 | Fill #0

## 2016-03-02 ENCOUNTER — Other Ambulatory Visit: Payer: Self-pay | Admitting: Gastroenterology

## 2016-03-19 DIAGNOSIS — L820 Inflamed seborrheic keratosis: Secondary | ICD-10-CM | POA: Diagnosis not present

## 2016-03-19 DIAGNOSIS — L918 Other hypertrophic disorders of the skin: Secondary | ICD-10-CM | POA: Diagnosis not present

## 2016-03-19 DIAGNOSIS — L738 Other specified follicular disorders: Secondary | ICD-10-CM | POA: Diagnosis not present

## 2016-03-22 MED FILL — oxyCODONE HCL 10 MG TABS: 10 | 30 days supply | Qty: 180 | Fill #0

## 2016-03-22 MED FILL — BUTALBITAL-ACETAMINOPHN 50-: 50-325 | 8 days supply | Qty: 32 | Fill #0

## 2016-03-22 MED FILL — MORPHINE SULFATE ER 30 MG C: 30 | 30 days supply | Qty: 60 | Fill #0

## 2016-04-02 DIAGNOSIS — Z23 Encounter for immunization: Secondary | ICD-10-CM | POA: Diagnosis not present

## 2016-04-22 MED FILL — MORPHINE SULFATE ER 30 MG C: 30 | 30 days supply | Qty: 60 | Fill #0

## 2016-04-22 MED FILL — oxyCODONE HCL 10 MG TABS: 10 | 30 days supply | Qty: 180 | Fill #0

## 2016-04-23 ENCOUNTER — Telehealth: Payer: Self-pay

## 2016-04-23 ENCOUNTER — Other Ambulatory Visit: Payer: Self-pay

## 2016-04-23 NOTE — Telephone Encounter (Signed)
Called patient to remind him to come get lab work.

## 2016-04-29 ENCOUNTER — Encounter: Payer: Self-pay | Admitting: Gastroenterology

## 2016-04-29 ENCOUNTER — Other Ambulatory Visit (INDEPENDENT_AMBULATORY_CARE_PROVIDER_SITE_OTHER): Payer: Medicare Other

## 2016-04-29 DIAGNOSIS — R7401 Elevation of levels of liver transaminase levels: Secondary | ICD-10-CM

## 2016-04-29 DIAGNOSIS — R740 Nonspecific elevation of levels of transaminase and lactic acid dehydrogenase [LDH]: Secondary | ICD-10-CM | POA: Diagnosis not present

## 2016-04-29 LAB — HEPATIC FUNCTION PANEL
ALBUMIN: 4.4 g/dL (ref 3.5–5.2)
ALT: 28 U/L (ref 0–53)
AST: 16 U/L (ref 0–37)
Alkaline Phosphatase: 75 U/L (ref 39–117)
BILIRUBIN DIRECT: 0.1 mg/dL (ref 0.0–0.3)
TOTAL PROTEIN: 7 g/dL (ref 6.0–8.3)
Total Bilirubin: 0.4 mg/dL (ref 0.2–1.2)

## 2016-04-30 NOTE — Progress Notes (Signed)
Letter mailed

## 2016-05-21 MED FILL — MORPHINE SULFATE ER 30 MG C: 30 | 30 days supply | Qty: 60 | Fill #0

## 2016-05-21 MED FILL — oxyCODONE HCL 10 MG TABS: 10 | 30 days supply | Qty: 180 | Fill #0

## 2016-06-17 MED FILL — clonazePAM 0.5 MG TABS: 0.5 | 15 days supply | Qty: 30 | Fill #0

## 2016-06-18 MED FILL — oxyCODONE HCL 10 MG TABS: 10 | 30 days supply | Qty: 180 | Fill #0

## 2016-06-18 MED FILL — MORPHINE SULFATE ER 30 MG C: 30 | 30 days supply | Qty: 60 | Fill #0

## 2016-06-27 MED FILL — BUTALBITAL-ACETAMINOPHN 50-: 50-325 | 8 days supply | Qty: 32 | Fill #0

## 2016-07-15 DIAGNOSIS — Z125 Encounter for screening for malignant neoplasm of prostate: Secondary | ICD-10-CM | POA: Diagnosis not present

## 2016-07-15 DIAGNOSIS — I10 Essential (primary) hypertension: Secondary | ICD-10-CM | POA: Diagnosis not present

## 2016-07-15 DIAGNOSIS — E784 Other hyperlipidemia: Secondary | ICD-10-CM | POA: Diagnosis not present

## 2016-07-15 DIAGNOSIS — R739 Hyperglycemia, unspecified: Secondary | ICD-10-CM | POA: Diagnosis not present

## 2016-07-17 MED FILL — MORPHINE SULFATE ER 30 MG C: 30 | 30 days supply | Qty: 60 | Fill #0

## 2016-07-17 MED FILL — oxyCODONE HCL 10 MG TABS: 10 | 30 days supply | Qty: 180 | Fill #0

## 2016-07-25 DIAGNOSIS — Z1212 Encounter for screening for malignant neoplasm of rectum: Secondary | ICD-10-CM | POA: Diagnosis not present

## 2016-07-30 MED FILL — clonazePAM 0.5 MG TABS: 0.5 | 15 days supply | Qty: 30 | Fill #1

## 2016-08-16 MED FILL — oxyCODONE HCL 10 MG TABS: 10 | 30 days supply | Qty: 180 | Fill #0

## 2016-08-16 MED FILL — MORPHINE SULF ER 30 MG TAB: 30 | 30 days supply | Qty: 60 | Fill #0

## 2016-08-20 DIAGNOSIS — E119 Type 2 diabetes mellitus without complications: Secondary | ICD-10-CM | POA: Diagnosis not present

## 2016-08-20 DIAGNOSIS — F418 Other specified anxiety disorders: Secondary | ICD-10-CM | POA: Diagnosis not present

## 2016-08-20 DIAGNOSIS — Z0000 Encounter for general adult medical examination without abnormal findings: Secondary | ICD-10-CM | POA: Diagnosis not present

## 2016-08-20 DIAGNOSIS — Z23 Encounter for immunization: Secondary | ICD-10-CM | POA: Diagnosis not present

## 2016-08-21 ENCOUNTER — Other Ambulatory Visit: Payer: Self-pay | Admitting: Internal Medicine

## 2016-08-21 DIAGNOSIS — Z720 Tobacco use: Secondary | ICD-10-CM

## 2016-08-26 ENCOUNTER — Ambulatory Visit
Admission: RE | Admit: 2016-08-26 | Discharge: 2016-08-26 | Disposition: A | Discharge status: Home / Self Care | Payer: Medicare Other | Source: Ambulatory Visit | Attending: Internal Medicine | Admitting: Internal Medicine

## 2016-08-26 DIAGNOSIS — Z720 Tobacco use: Secondary | ICD-10-CM

## 2016-08-26 DIAGNOSIS — F17210 Nicotine dependence, cigarettes, uncomplicated: Secondary | ICD-10-CM | POA: Diagnosis not present

## 2016-09-16 MED FILL — oxyCODONE HCL 10 MG TABS: 10 | 30 days supply | Qty: 180 | Fill #0

## 2016-09-16 MED FILL — MORPHINE SULF 30 MG TAB SA: 30 | 30 days supply | Qty: 60 | Fill #0

## 2016-10-14 MED FILL — MORPHINE SULF ER 30 MG TAB: 30 | 30 days supply | Qty: 60 | Fill #0

## 2016-10-14 MED FILL — oxyCODONE HCL 10 MG TABS: 10 | 30 days supply | Qty: 180 | Fill #0

## 2016-10-17 MED FILL — AMITRIPTYLINE HCL 10 MG TAB: 10 | 30 days supply | Qty: 60 | Fill #0

## 2016-11-12 MED FILL — clonazePAM 0.5 MG TABS: 0.5 | 15 days supply | Qty: 30 | Fill #0

## 2016-11-14 MED FILL — oxyCODONE HCL 10 MG TABS: 10 | 30 days supply | Qty: 180 | Fill #0

## 2016-11-14 MED FILL — MORPHINE SULF ER 30 MG TAB: 30 | 30 days supply | Qty: 60 | Fill #0

## 2016-11-26 DIAGNOSIS — I10 Essential (primary) hypertension: Secondary | ICD-10-CM | POA: Diagnosis not present

## 2016-11-26 DIAGNOSIS — E119 Type 2 diabetes mellitus without complications: Secondary | ICD-10-CM | POA: Diagnosis not present

## 2016-11-26 DIAGNOSIS — G4733 Obstructive sleep apnea (adult) (pediatric): Secondary | ICD-10-CM | POA: Diagnosis not present

## 2016-12-13 MED FILL — AMITRIPTYLINE HCL 10 MG TAB: 10 | 30 days supply | Qty: 60 | Fill #1

## 2016-12-13 MED FILL — BUTALBITAL-ACETAMINOPHN 50-: 50-325 | 8 days supply | Qty: 32 | Fill #0

## 2016-12-13 MED FILL — MORPHINE SULF 30 MG TAB SA: 30 | 30 days supply | Qty: 60 | Fill #0

## 2016-12-13 MED FILL — oxyCODONE HCL 10 MG TABS: 10 | 30 days supply | Qty: 180 | Fill #0

## 2017-01-14 MED FILL — MORPHINE SULF 30 MG TAB SA: 30 | 30 days supply | Qty: 60 | Fill #0

## 2017-01-14 MED FILL — oxyCODONE HCL 10 MG TABS: 10 | 30 days supply | Qty: 180 | Fill #0

## 2017-01-29 MED FILL — AMITRIPTYLINE HCL 10 MG TAB: 10 | 30 days supply | Qty: 60 | Fill #2

## 2017-02-10 MED FILL — MORPHINE SULF ER 30 MG TAB: 30 | 30 days supply | Qty: 60 | Fill #0

## 2017-02-10 MED FILL — oxyCODONE HCL 10 MG TABS: 10 | 30 days supply | Qty: 180 | Fill #0

## 2017-02-10 MED FILL — CYCLOBENZAPRINE 5 MG TABLET: 5 | 10 days supply | Qty: 30 | Fill #0

## 2017-03-11 MED FILL — AMITRIPTYLINE HCL 10 MG TAB: 10 | 30 days supply | Qty: 60 | Fill #3

## 2017-03-11 MED FILL — MORPHINE SULF ER 30 MG TAB: 30 | 30 days supply | Qty: 60 | Fill #0

## 2017-03-18 MED FILL — BUTALBITAL-ACETAMINOPHN 50-: 50-325 | 8 days supply | Qty: 32 | Fill #0

## 2017-03-26 MED FILL — oxyCODONE HCL 10 MG TABS: 10 | 30 days supply | Qty: 180 | Fill #0

## 2017-04-01 DIAGNOSIS — E7849 Other hyperlipidemia: Secondary | ICD-10-CM | POA: Diagnosis not present

## 2017-04-01 DIAGNOSIS — E119 Type 2 diabetes mellitus without complications: Secondary | ICD-10-CM | POA: Diagnosis not present

## 2017-04-01 DIAGNOSIS — I10 Essential (primary) hypertension: Secondary | ICD-10-CM | POA: Diagnosis not present

## 2017-04-10 MED FILL — MORPHINE SULF ER 30 MG TAB: 30 | 30 days supply | Qty: 60 | Fill #0

## 2017-04-10 MED FILL — AMITRIPTYLINE HCL 10 MG TAB: 10 | 30 days supply | Qty: 60 | Fill #0

## 2017-05-09 MED FILL — MORPHINE SULF ER 30 MG TAB: 30 | 30 days supply | Qty: 60 | Fill #0

## 2017-05-09 MED FILL — oxyCODONE HCL 10 MG TABS: 10 | 30 days supply | Qty: 180 | Fill #0

## 2017-05-09 MED FILL — AMITRIPTYLINE HCL 10 MG TAB: 10 | 30 days supply | Qty: 60 | Fill #1

## 2017-06-05 MED FILL — AMITRIPTYLINE HCL 10 MG TAB: 10 | 30 days supply | Qty: 60 | Fill #0

## 2017-06-06 MED FILL — oxyCODONE HCL 10 MG TABS: 10 | 30 days supply | Qty: 180 | Fill #0

## 2017-06-06 MED FILL — MORPHINE SULF ER 30 MG TAB: 30 | 30 days supply | Qty: 60 | Fill #0

## 2017-07-09 MED FILL — oxyCODONE HCL 10 MG TABS: 10 | 30 days supply | Qty: 180 | Fill #0

## 2017-08-06 MED FILL — oxyCODONE HCL 10 MG TABS: 10 | 30 days supply | Qty: 180 | Fill #0

## 2017-08-14 DIAGNOSIS — E119 Type 2 diabetes mellitus without complications: Secondary | ICD-10-CM | POA: Diagnosis not present

## 2017-08-14 DIAGNOSIS — E291 Testicular hypofunction: Secondary | ICD-10-CM | POA: Diagnosis not present

## 2017-08-14 DIAGNOSIS — E7849 Other hyperlipidemia: Secondary | ICD-10-CM | POA: Diagnosis not present

## 2017-08-14 DIAGNOSIS — Z125 Encounter for screening for malignant neoplasm of prostate: Secondary | ICD-10-CM | POA: Diagnosis not present

## 2017-08-14 DIAGNOSIS — R82998 Other abnormal findings in urine: Secondary | ICD-10-CM | POA: Diagnosis not present

## 2017-08-14 DIAGNOSIS — I10 Essential (primary) hypertension: Secondary | ICD-10-CM | POA: Diagnosis not present

## 2017-08-28 DIAGNOSIS — Z0000 Encounter for general adult medical examination without abnormal findings: Secondary | ICD-10-CM | POA: Diagnosis not present

## 2017-08-28 DIAGNOSIS — E1169 Type 2 diabetes mellitus with other specified complication: Secondary | ICD-10-CM | POA: Diagnosis not present

## 2017-08-28 DIAGNOSIS — F1320 Sedative, hypnotic or anxiolytic dependence, uncomplicated: Secondary | ICD-10-CM | POA: Diagnosis not present

## 2017-09-01 ENCOUNTER — Other Ambulatory Visit: Payer: Self-pay | Admitting: Internal Medicine

## 2017-09-01 DIAGNOSIS — Z0000 Encounter for general adult medical examination without abnormal findings: Secondary | ICD-10-CM

## 2017-09-01 DIAGNOSIS — Z720 Tobacco use: Secondary | ICD-10-CM

## 2017-09-02 DIAGNOSIS — Z1212 Encounter for screening for malignant neoplasm of rectum: Secondary | ICD-10-CM | POA: Diagnosis not present

## 2017-09-04 ENCOUNTER — Ambulatory Visit
Admission: RE | Admit: 2017-09-04 | Discharge: 2017-09-04 | Disposition: A | Discharge status: Home / Self Care | Payer: Medicare Other | Source: Ambulatory Visit | Attending: Internal Medicine | Admitting: Internal Medicine

## 2017-09-04 DIAGNOSIS — Z720 Tobacco use: Secondary | ICD-10-CM

## 2017-09-04 DIAGNOSIS — Z87891 Personal history of nicotine dependence: Secondary | ICD-10-CM | POA: Diagnosis not present

## 2017-09-04 DIAGNOSIS — Z0000 Encounter for general adult medical examination without abnormal findings: Secondary | ICD-10-CM

## 2017-09-05 MED FILL — oxyCODONE HCL 10 MG TABS: 10 | 30 days supply | Qty: 180 | Fill #0

## 2017-10-03 MED FILL — oxyCODONE HCL 10 MG TABS: 10 | 30 days supply | Qty: 180 | Fill #0

## 2017-11-03 MED FILL — oxyCODONE HCL 10 MG TABS: 10 | 30 days supply | Qty: 180 | Fill #0

## 2017-11-20 DIAGNOSIS — L820 Inflamed seborrheic keratosis: Secondary | ICD-10-CM | POA: Diagnosis not present

## 2017-11-20 DIAGNOSIS — L918 Other hypertrophic disorders of the skin: Secondary | ICD-10-CM | POA: Diagnosis not present

## 2017-12-01 MED FILL — oxyCODONE HCL 10 MG TABS: 10 | 30 days supply | Qty: 180 | Fill #0

## 2017-12-01 MED FILL — BUTALBITAL-ACETAMINOPHN 50-: 50-325 | 8 days supply | Qty: 32 | Fill #0

## 2017-12-01 MED FILL — GABAPENTIN 300 MG CAPSULE: 300 | 30 days supply | Qty: 90 | Fill #0

## 2017-12-25 DIAGNOSIS — E1169 Type 2 diabetes mellitus with other specified complication: Secondary | ICD-10-CM | POA: Diagnosis not present

## 2017-12-25 DIAGNOSIS — F1320 Sedative, hypnotic or anxiolytic dependence, uncomplicated: Secondary | ICD-10-CM | POA: Diagnosis not present

## 2017-12-25 DIAGNOSIS — M791 Myalgia, unspecified site: Secondary | ICD-10-CM | POA: Diagnosis not present

## 2017-12-25 DIAGNOSIS — I10 Essential (primary) hypertension: Secondary | ICD-10-CM | POA: Diagnosis not present

## 2018-01-01 MED FILL — oxyCODONE HCL 10 MG TABS: 10 | 30 days supply | Qty: 180 | Fill #0

## 2018-02-02 MED FILL — oxyCODONE HCL 10 MG TABS: 10 | 30 days supply | Qty: 180 | Fill #0

## 2018-02-14 DIAGNOSIS — Z23 Encounter for immunization: Secondary | ICD-10-CM | POA: Diagnosis not present

## 2018-03-04 MED FILL — oxyCODONE HCL 10 MG TABS: 10 | 30 days supply | Qty: 180 | Fill #0

## 2018-03-25 DIAGNOSIS — Z720 Tobacco use: Secondary | ICD-10-CM | POA: Diagnosis not present

## 2018-03-25 DIAGNOSIS — I10 Essential (primary) hypertension: Secondary | ICD-10-CM | POA: Diagnosis not present

## 2018-03-25 DIAGNOSIS — E1169 Type 2 diabetes mellitus with other specified complication: Secondary | ICD-10-CM | POA: Diagnosis not present

## 2018-04-02 MED FILL — oxyCODONE HCL 10 MG TABS: 10 | 30 days supply | Qty: 180 | Fill #0

## 2018-05-04 MED FILL — GABAPENTIN 300 MG CAPSULE: 300 | 30 days supply | Qty: 90 | Fill #1

## 2018-05-04 MED FILL — BUTALBITAL-ACETAMINOPHN 50-: 50-325 | 8 days supply | Qty: 32 | Fill #1

## 2018-05-04 MED FILL — oxyCODONE HCL 10 MG TABS: 10 | 30 days supply | Qty: 180 | Fill #0

## 2018-05-28 MED FILL — GABAPENTIN 300 MG CAPSULE: 300 | 30 days supply | Qty: 90 | Fill #0

## 2018-06-04 MED FILL — oxyCODONE HCL 10 MG TABS: 10 | 30 days supply | Qty: 180 | Fill #0

## 2018-07-08 MED FILL — oxyCODONE HCL 10 MG TABS: 10 | 30 days supply | Qty: 180 | Fill #0

## 2018-08-04 MED FILL — GABAPENTIN 300 MG CAPSULE: 300 | 30 days supply | Qty: 90 | Fill #1

## 2018-08-04 MED FILL — oxyCODONE HCL 10 MG TABS: 10 | 30 days supply | Qty: 180 | Fill #0

## 2018-08-27 DIAGNOSIS — Z125 Encounter for screening for malignant neoplasm of prostate: Secondary | ICD-10-CM | POA: Diagnosis not present

## 2018-08-27 DIAGNOSIS — I10 Essential (primary) hypertension: Secondary | ICD-10-CM | POA: Diagnosis not present

## 2018-08-27 DIAGNOSIS — E291 Testicular hypofunction: Secondary | ICD-10-CM | POA: Diagnosis not present

## 2018-08-27 DIAGNOSIS — E7849 Other hyperlipidemia: Secondary | ICD-10-CM | POA: Diagnosis not present

## 2018-08-27 DIAGNOSIS — E1169 Type 2 diabetes mellitus with other specified complication: Secondary | ICD-10-CM | POA: Diagnosis not present

## 2018-08-31 DIAGNOSIS — R82998 Other abnormal findings in urine: Secondary | ICD-10-CM | POA: Diagnosis not present

## 2018-09-03 DIAGNOSIS — Z0000 Encounter for general adult medical examination without abnormal findings: Secondary | ICD-10-CM | POA: Diagnosis not present

## 2018-09-03 DIAGNOSIS — R918 Other nonspecific abnormal finding of lung field: Secondary | ICD-10-CM | POA: Diagnosis not present

## 2018-09-03 DIAGNOSIS — D126 Benign neoplasm of colon, unspecified: Secondary | ICD-10-CM | POA: Diagnosis not present

## 2018-09-03 DIAGNOSIS — G8921 Chronic pain due to trauma: Secondary | ICD-10-CM | POA: Diagnosis not present

## 2018-09-03 MED FILL — oxyCODONE HCL 10 MG TABS: 10 | 30 days supply | Qty: 180 | Fill #0

## 2018-09-03 MED FILL — DULoxetine HCL 30 MG CPEP: 30 | 30 days supply | Qty: 30 | Fill #0

## 2018-09-21 MED FILL — GABAPENTIN 300 MG CAPSULE: 300 | 30 days supply | Qty: 90 | Fill #0

## 2018-09-28 MED FILL — oxyCODONE HCL 10 MG TABS: 10 | 30 days supply | Qty: 180 | Fill #0

## 2018-10-29 MED FILL — oxyCODONE HCL 10 MG TABS: 10 | 30 days supply | Qty: 180 | Fill #0

## 2018-11-02 ENCOUNTER — Other Ambulatory Visit: Payer: Self-pay | Admitting: Internal Medicine

## 2018-11-02 DIAGNOSIS — Z720 Tobacco use: Secondary | ICD-10-CM

## 2018-11-04 ENCOUNTER — Other Ambulatory Visit: Payer: Self-pay | Admitting: Internal Medicine

## 2018-11-04 DIAGNOSIS — Z720 Tobacco use: Secondary | ICD-10-CM

## 2018-11-16 MED FILL — GABAPENTIN 300 MG CAPSULE: 300 | 30 days supply | Qty: 90 | Fill #0

## 2018-11-19 ENCOUNTER — Other Ambulatory Visit: Payer: Self-pay | Admitting: Internal Medicine

## 2018-11-19 ENCOUNTER — Ambulatory Visit
Admission: RE | Admit: 2018-11-19 | Discharge: 2018-11-19 | Disposition: A | Discharge status: Home / Self Care | Payer: Medicare Other | Source: Ambulatory Visit | Attending: Internal Medicine | Admitting: Internal Medicine

## 2018-11-19 DIAGNOSIS — Z720 Tobacco use: Secondary | ICD-10-CM

## 2018-11-19 DIAGNOSIS — F17210 Nicotine dependence, cigarettes, uncomplicated: Secondary | ICD-10-CM | POA: Diagnosis not present

## 2018-11-26 MED FILL — oxyCODONE HCL 10 MG TABS: 10 | 30 days supply | Qty: 180 | Fill #0

## 2018-12-03 ENCOUNTER — Encounter: Payer: Self-pay | Admitting: Gastroenterology

## 2018-12-14 ENCOUNTER — Encounter: Payer: Self-pay | Admitting: Gastroenterology

## 2018-12-23 ENCOUNTER — Encounter: Payer: Self-pay | Admitting: Cardiology

## 2018-12-23 ENCOUNTER — Ambulatory Visit: Payer: Medicare Other | Admitting: Cardiology

## 2018-12-23 ENCOUNTER — Other Ambulatory Visit: Payer: Self-pay

## 2018-12-23 VITALS — BP 154/94 | HR 79 | Ht 72.0 in | Wt 258.0 lb

## 2018-12-23 DIAGNOSIS — E669 Obesity, unspecified: Secondary | ICD-10-CM

## 2018-12-23 DIAGNOSIS — I10 Essential (primary) hypertension: Secondary | ICD-10-CM | POA: Diagnosis not present

## 2018-12-23 DIAGNOSIS — I2510 Atherosclerotic heart disease of native coronary artery without angina pectoris: Secondary | ICD-10-CM

## 2018-12-23 DIAGNOSIS — E668 Other obesity: Secondary | ICD-10-CM

## 2018-12-23 DIAGNOSIS — E782 Mixed hyperlipidemia: Secondary | ICD-10-CM

## 2018-12-23 DIAGNOSIS — F17200 Nicotine dependence, unspecified, uncomplicated: Secondary | ICD-10-CM | POA: Diagnosis not present

## 2018-12-23 HISTORY — DX: Essential (primary) hypertension: I10

## 2018-12-23 HISTORY — DX: Atherosclerotic heart disease of native coronary artery without angina pectoris: I25.10

## 2018-12-23 MED ORDER — AMLODIPINE-VALSARTAN-HCTZ 5-160-25 MG PO TABS: 1.0000 | ORAL_TABLET | Freq: Every day | ORAL | 1 refills | Status: DC

## 2018-12-23 MED ORDER — ROSUVASTATIN CALCIUM 10 MG PO TABS: 10.0000 mg | ORAL_TABLET | Freq: Every day | ORAL | 2 refills | Status: AC

## 2018-12-23 NOTE — Progress Notes (Signed)
Primary Physician:  Velna Hatchet, MD   Patient ID: Thomas Macdonald, male    DOB: 04-26-1956, 63 y.o.   MRN: [id number]  Subjective:    Chief Complaint  Patient presents with  . Coronary Artery Disease  . New Patient (Initial Visit)    HPI: Thomas Macdonald  is a 63 y.o. 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  Patient denies any symptoms of chest pain or dyspnea on exertion. He reports that he was previously walking 2 miles per day until the pandemic, but has not been as active for the last few months. He denies any leg edema, PND, or orthopnea. He reports that hypertension has been uncontrolled for a long time. He was just recently started on crestor by his PCP after he had CT scan.   He is followed by pain management for chronic neck pain from previous neck surgery.   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   Past Medical History:  Diagnosis Date  . Anxiety   . CAD (coronary artery disease) 12/23/2018  . Chronic headaches   . Chronic neck pain   . Depression   . H/O hypogonadism   . Headache   . HLD (hyperlipidemia)   . HTN (hypertension) 12/23/2018  . Hyperglycemia   . Hypertension   . Obesity   . OSA (obstructive sleep apnea)    CPAP  . Snoring   . Tobacco abuse     Past Surgical History:  Procedure Laterality Date  . CERVICAL SPINE SURGERY  2007  . SHOULDER ARTHROSCOPY Right 2006    Social History   Socioeconomic History  . Marital status: Married    Spouse name: Not on file  . Number of children: 4  . Years of education: 45  . Highest education level: Not on file  Occupational History  . Occupation: Disabled   Social Needs  . Financial resource strain: Not on file  . Food insecurity    Worry: Not on file    Inability: Not on file  . Transportation needs    Medical: Not on file   Non-medical: Not on file  Tobacco Use  . Smoking status: Current Every Day Smoker    Packs/day: 1.00    Types: Cigarettes  . Smokeless tobacco: Never Used  Substance and Sexual Activity  . Alcohol use: No    Alcohol/week: 0.0 standard drinks  . Drug use: No  . Sexual activity: Not on file  Lifestyle  . Physical activity    Days per week: Not on file    Minutes per session: Not on file  . Stress: Not on file  Relationships  . Social Herbalist on phone: Not on file    Gets together: Not on file    Attends religious service: Not on file    Active member of club or organization: Not on file    Attends meetings of clubs or organizations: Not on file    Relationship status: Not on file  . Intimate partner violence    Fear of current or ex partner: Not on file    Emotionally abused: Not on file    Physically abused: Not on file    Forced sexual activity: Not on file  Other Topics Concern  . Not on file  Social History Narrative  5 cups of soda or coffee a day     Review of Systems  Constitution: Negative for decreased appetite, malaise/fatigue, weight gain and weight loss.  Eyes: Negative for visual disturbance.  Cardiovascular: Negative for chest pain, claudication, dyspnea on exertion, leg swelling, orthopnea, palpitations and syncope.  Respiratory: Negative for hemoptysis and wheezing.   Endocrine: Negative for cold intolerance and heat intolerance.  Hematologic/Lymphatic: Does not bruise/bleed easily.  Skin: Negative for nail changes.  Musculoskeletal: Positive for back pain and joint pain. Negative for muscle weakness and myalgias.  Gastrointestinal: Negative for abdominal pain, change in bowel habit, nausea and vomiting.  Neurological: Negative for difficulty with concentration, dizziness, focal weakness and headaches.  Psychiatric/Behavioral: Negative for altered mental status and suicidal ideas.  All other systems reviewed and are negative.      Objective:  Blood pressure (!) 154/94, pulse 79, height 6' (1.829 m), weight 258 lb (117 kg), SpO2 96 %. Body mass index is 34.99 kg/m.    Physical Exam  Constitutional: He is oriented to person, place, and time. Vital signs are normal. He appears well-developed and well-nourished.  HENT:  Head: Normocephalic and atraumatic.  Neck: Normal range of motion.  Cardiovascular: Normal rate, regular rhythm, normal heart sounds and intact distal pulses.  Pulses:      Femoral pulses are 2+ on the right side and 2+ on the left side.      Popliteal pulses are 2+ on the right side and 2+ on the left side.       Dorsalis pedis pulses are 1+ on the right side and 1+ on the left side.       Posterior tibial pulses are 1+ on the right side and 1+ on the left side.  Pulmonary/Chest: Effort normal and breath sounds normal. No accessory muscle usage. No respiratory distress.  Abdominal: Soft. Bowel sounds are normal.  Musculoskeletal: Normal range of motion.  Neurological: He is alert and oriented to person, place, and time.  Skin: Skin is warm and dry.  Vitals reviewed.  Radiology: No results found.  Laboratory examination:    CMP Latest Ref Rng & Units 04/29/2016 10/17/2015 03/10/2012  Glucose 70 - 99 mg/dL - - 92  BUN 6 - 23 mg/dL - - 8  Creatinine 0.50 - 1.35 mg/dL - - 0.80  Sodium 135 - 145 mEq/L - - 141  Potassium 3.5 - 5.1 mEq/L - - 4.2  Chloride 96 - 112 mEq/L - - 101  Total Protein 6.0 - 8.3 g/dL 7.0 7.1 -  Total Bilirubin 0.2 - 1.2 mg/dL 0.4 0.4 -  Alkaline Phos 39 - 117 U/L 75 78 -  AST 0 - 37 U/L 16 18 -  ALT 0 - 53 U/L 28 35 -   CBC Latest Ref Rng & Units 03/10/2012 03/10/2012  WBC 4.0 - 10.5 K/uL - 11.4(H)  Hemoglobin 13.0 - 17.0 g/dL 16.0 15.6  Hematocrit 39.0 - 52.0 % 47.0 44.0  Platelets 150 - 400 K/uL - 262   Lipid Panel  No results found for: CHOL, TRIG, HDL, CHOLHDL, VLDL, LDLCALC, LDLDIRECT HEMOGLOBIN A1C No results found for: HGBA1C, MPG TSH No results for  input(s): TSH in the last 8760 hours.  PRN Meds:. Medications Discontinued During This Encounter  Medication Reason  . dicyclomine (BENTYL) 20 MG tablet Error  . morphine (MS CONTIN) 30 MG 12 hr tablet Error  . testosterone cypionate (DEPOTESTOSTERONE CYPIONATE) 200 MG/ML injection Error  . amLODipine (NORVASC) 5 MG tablet Discontinued by provider  .  hydrochlorothiazide (HYDRODIURIL) 25 MG tablet Discontinued by provider  . lisinopril (PRINIVIL,ZESTRIL) 40 MG tablet Discontinued by provider  . rosuvastatin (CRESTOR) 5 MG tablet Discontinued by provider   Current Meds  Medication Sig  . aspirin EC 81 MG tablet Take 81 mg by mouth daily.  . butalbital-acetaminophen-caffeine (FIORICET) 50-325-40 MG tablet Take by mouth daily as needed for headache.  . clonazePAM (KLONOPIN) 0.5 MG tablet Take 0.5 mg by mouth 2 (two) times daily as needed for anxiety.  . gabapentin (NEURONTIN) 300 MG capsule Take 300 mg by mouth 3 (three) times daily.  . Oxycodone HCl 10 MG TABS Take 10 mg by mouth every 4 (four) hours as needed for pain.   . [DISCONTINUED] amLODipine (NORVASC) 5 MG tablet Take 5 mg by mouth daily.  . [DISCONTINUED] hydrochlorothiazide (HYDRODIURIL) 25 MG tablet Take 1 tablet by mouth as needed.  . [DISCONTINUED] lisinopril (PRINIVIL,ZESTRIL) 40 MG tablet Take 40 mg by mouth daily.  . [DISCONTINUED] rosuvastatin (CRESTOR) 5 MG tablet Take 5 mg by mouth daily.    Cardiac Studies:     Assessment:     ICD-10-CM   1. Coronary artery calcification seen on CT scan  I25.10 EKG 12-Lead    PCV MYOCARDIAL PERFUSION WITH LEXISCAN  2. Essential hypertension  I10 PCV ECHOCARDIOGRAM COMPLETE  3. Mixed hyperlipidemia  E78.2   4. Tobacco use disorder  F17.200   5. Moderate obesity  E66.8     EKG 12/23/2018: Normal sinus rhythm at 83 bpm, normal axis, diffuse non-specific T wave abnormality.  Recommendations:   Patient has multiple risk factors for CAD, and recently noted to have three-vessel  coronary calcification on CT scan.  He previously was walking 2 miles per day, but recently has not been able to do this due to the pandemic.  He is also limited in his activity due to his neck pain.  Given his risk factors and CT scan findings, will obtain Lexiscan nuclear stress testing.  He also has history of hypertension that is not well controlled.  Will obtain echocardiogram to exclude any structural abnormalities.  No clinical evidence of heart failure by clinical exam.  He will continue to need risk factor modification.  I have discontinued his lisinopril, hydrochlorothiazide, and amlodipine.  Will change to Exforge hydrochlorothiazide for better blood pressure control.  I have also increased his Crestor up to 10 mg daily.  His LDL was elevated previously.  He is currently on aspirin therapy and advised him to continue with this.  He does have diminished DP and PT pulses, but otherwise pulses are normal.  I have encouraged him to work towards weight loss with diet modifications.  I have also extensively discussed the importance of quitting smoking.  He is currently not interested in quitting.  I have encouraged him to try using nicotine patches if he were to try to want to quit.  Could also consider medication.  We will continue to monitor his progress.  We will see him back after the test for further recommendations and reevaluation.   *I have discussed this case with Dr. Einar Gip and he personally examined the patient and participated in formulating the plan.*   Miquel Dunn, MSN, APRN, FNP-C [facility]. [place] Office: [phone] Fax: [phone]

## 2018-12-25 ENCOUNTER — Other Ambulatory Visit: Payer: Self-pay | Admitting: Cardiology

## 2018-12-27 ENCOUNTER — Encounter: Payer: Self-pay | Admitting: Cardiology

## 2018-12-28 MED FILL — GABAPENTIN 300 MG CAPSULE: 300 | 30 days supply | Qty: 90 | Fill #1

## 2019-01-01 MED FILL — oxyCODONE HCL 10 MG TABS: 10 | 30 days supply | Qty: 180 | Fill #0

## 2019-01-11 ENCOUNTER — Other Ambulatory Visit: Payer: Self-pay

## 2019-01-11 ENCOUNTER — Ambulatory Visit (INDEPENDENT_AMBULATORY_CARE_PROVIDER_SITE_OTHER): Payer: Medicare Other

## 2019-01-11 DIAGNOSIS — I2510 Atherosclerotic heart disease of native coronary artery without angina pectoris: Secondary | ICD-10-CM | POA: Diagnosis not present

## 2019-01-14 ENCOUNTER — Ambulatory Visit (INDEPENDENT_AMBULATORY_CARE_PROVIDER_SITE_OTHER): Payer: Medicare Other

## 2019-01-14 ENCOUNTER — Other Ambulatory Visit: Payer: Self-pay

## 2019-01-14 DIAGNOSIS — I10 Essential (primary) hypertension: Secondary | ICD-10-CM | POA: Diagnosis not present

## 2019-01-15 ENCOUNTER — Ambulatory Visit (AMBULATORY_SURGERY_CENTER): Payer: Self-pay

## 2019-01-15 ENCOUNTER — Other Ambulatory Visit: Payer: Self-pay

## 2019-01-15 VITALS — Ht 72.0 in | Wt 258.0 lb

## 2019-01-15 DIAGNOSIS — Z86010 Personal history of colonic polyps: Secondary | ICD-10-CM

## 2019-01-15 MED ORDER — NA SULFATE-K SULFATE-MG SULF 17.5-3.13-1.6 GM/177ML PO SOLN: 1.0000 | Freq: Once | ORAL | 0 refills | Status: AC

## 2019-01-15 NOTE — Progress Notes (Signed)
Low risk stress test. Will discuss at office visit

## 2019-01-15 NOTE — Progress Notes (Signed)
Denies allergies to eggs or soy products. Denies complication of anesthesia or sedation. Denies use of weight loss medication. Denies use of O2.   Emmi instructions given for colonoscopy.  Patient was given extra time in PV to review his instructions. Patient verbalizes instructions.

## 2019-01-19 ENCOUNTER — Encounter: Payer: Self-pay | Admitting: Gastroenterology

## 2019-01-19 MED FILL — BUTALBITAL-ACETAMINOPHEN 50: 50-325 | 8 days supply | Qty: 32 | Fill #0

## 2019-01-22 ENCOUNTER — Other Ambulatory Visit: Payer: Self-pay

## 2019-01-22 ENCOUNTER — Ambulatory Visit: Payer: Medicare Other | Admitting: Cardiology

## 2019-01-22 ENCOUNTER — Encounter: Payer: Self-pay | Admitting: Cardiology

## 2019-01-22 VITALS — BP 143/66 | HR 85 | Temp 98.0°F | Ht 72.0 in | Wt 256.0 lb

## 2019-01-22 DIAGNOSIS — F17200 Nicotine dependence, unspecified, uncomplicated: Secondary | ICD-10-CM | POA: Diagnosis not present

## 2019-01-22 DIAGNOSIS — I2510 Atherosclerotic heart disease of native coronary artery without angina pectoris: Secondary | ICD-10-CM | POA: Diagnosis not present

## 2019-01-22 DIAGNOSIS — E782 Mixed hyperlipidemia: Secondary | ICD-10-CM | POA: Diagnosis not present

## 2019-01-22 DIAGNOSIS — E668 Other obesity: Secondary | ICD-10-CM

## 2019-01-22 DIAGNOSIS — I10 Essential (primary) hypertension: Secondary | ICD-10-CM

## 2019-01-22 MED ORDER — AMLODIPINE-VALSARTAN-HCTZ 5-160-25 MG PO TABS: 1.0000 | ORAL_TABLET | Freq: Every day | ORAL | 2 refills | Status: DC

## 2019-01-22 NOTE — Progress Notes (Signed)
Primary Physician:  Velna Hatchet, MD   Patient ID: Thomas Macdonald, male    DOB: 07-Jun-1955, 63 y.o.   MRN: [id number]  Subjective:    Chief Complaint  Patient presents with  . Coronary Artery Disease  . Hypertension  . Follow-up  . Results    NUC and echo    HPI: Thomas Macdonald  is a 63 y.o. 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  Patient denies any symptoms of chest pain or dyspnea on exertion. He reports that he was previously walking 2 miles per day until the pandemic, but has not been as active for the last few months. He denies any leg edema, PND, or orthopnea. He reports that hypertension has been uncontrolled for a long time. He was just recently started on crestor by his PCP after he had CT scan. He underwent stress testing and echocardiogram and now presents for follow up.  States that he is feeling better and BP has been better controlled with medication changes.   He is followed by pain management for chronic neck pain from previous neck surgery.   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   Past Medical History:  Diagnosis Date  . Anxiety   . CAD (coronary artery disease) 12/23/2018  . Chronic headaches   . Chronic neck pain   . Depression   . H/O hypogonadism   . Headache   . HLD (hyperlipidemia)   . HTN (hypertension) 12/23/2018  . Hyperglycemia   . Hypertension   . Obesity   . OSA (obstructive sleep apnea)    CPAP  . Snoring   . Tobacco abuse     Past Surgical History:  Procedure Laterality Date  . CERVICAL SPINE SURGERY  2007  . SHOULDER ARTHROSCOPY Right 2006    Social History   Socioeconomic History  . Marital status: Married    Spouse name: Not on file  . Number of children: 4  . Years of education: 38  . Highest education level: Not on file  Occupational History   . Occupation: Disabled   Social Needs  . Financial resource strain: Not on file  . Food insecurity    Worry: Not on file    Inability: Not on file  . Transportation needs    Medical: Not on file    Non-medical: Not on file  Tobacco Use  . Smoking status: Current Every Day Smoker    Packs/day: 1.00    Types: Cigarettes  . Smokeless tobacco: Never Used  Substance and Sexual Activity  . Alcohol use: No    Alcohol/week: 0.0 standard drinks  . Drug use: No  . Sexual activity: Not on file  Lifestyle  . Physical activity    Days per week: Not on file    Minutes per session: Not on file  . Stress: Not on file  Relationships  . Social Herbalist on phone: Not on file    Gets together: Not on file    Attends religious service: Not on file    Active member of club or organization: Not on file    Attends meetings of clubs or organizations: Not on file    Relationship status: Not on file  . Intimate partner violence    Fear of current or ex partner: Not on  file    Emotionally abused: Not on file    Physically abused: Not on file    Forced sexual activity: Not on file  Other Topics Concern  . Not on file  Social History Narrative   5 cups of soda or coffee a day     Review of Systems  Constitution: Negative for decreased appetite, malaise/fatigue, weight gain and weight loss.  Eyes: Negative for visual disturbance.  Cardiovascular: Negative for chest pain, claudication, dyspnea on exertion, leg swelling, orthopnea, palpitations and syncope.  Respiratory: Negative for hemoptysis and wheezing.   Endocrine: Negative for cold intolerance and heat intolerance.  Hematologic/Lymphatic: Does not bruise/bleed easily.  Skin: Negative for nail changes.  Musculoskeletal: Positive for back pain and joint pain. Negative for muscle weakness and myalgias.  Gastrointestinal: Negative for abdominal pain, change in bowel habit, nausea and vomiting.  Neurological: Negative for  difficulty with concentration, dizziness, focal weakness and headaches.  Psychiatric/Behavioral: Negative for altered mental status and suicidal ideas.  All other systems reviewed and are negative.     Objective:  Blood pressure (!) 143/66, pulse 85, temperature 98 F (36.7 C), height 6' (1.829 m), weight 256 lb (116.1 kg), SpO2 94 %. Body mass index is 34.72 kg/m.    Physical Exam  Constitutional: He is oriented to person, place, and time. Vital signs are normal. He appears well-developed and well-nourished.  HENT:  Head: Normocephalic and atraumatic.  Neck: Normal range of motion.  Cardiovascular: Normal rate, regular rhythm, normal heart sounds and intact distal pulses.  Pulses:      Femoral pulses are 2+ on the right side and 2+ on the left side.      Popliteal pulses are 2+ on the right side and 2+ on the left side.       Dorsalis pedis pulses are 1+ on the right side and 1+ on the left side.       Posterior tibial pulses are 1+ on the right side and 1+ on the left side.  Pulmonary/Chest: Effort normal and breath sounds normal. No accessory muscle usage. No respiratory distress.  Abdominal: Soft. Bowel sounds are normal.  Musculoskeletal: Normal range of motion.  Neurological: He is alert and oriented to person, place, and time.  Skin: Skin is warm and dry.  Vitals reviewed.  Radiology: No results found.  Laboratory examination:    CMP Latest Ref Rng & Units 04/29/2016 10/17/2015 03/10/2012  Glucose 70 - 99 mg/dL - - 92  BUN 6 - 23 mg/dL - - 8  Creatinine 0.50 - 1.35 mg/dL - - 0.80  Sodium 135 - 145 mEq/L - - 141  Potassium 3.5 - 5.1 mEq/L - - 4.2  Chloride 96 - 112 mEq/L - - 101  Total Protein 6.0 - 8.3 g/dL 7.0 7.1 -  Total Bilirubin 0.2 - 1.2 mg/dL 0.4 0.4 -  Alkaline Phos 39 - 117 U/L 75 78 -  AST 0 - 37 U/L 16 18 -  ALT 0 - 53 U/L 28 35 -   CBC Latest Ref Rng & Units 03/10/2012 03/10/2012  WBC 4.0 - 10.5 K/uL - 11.4(H)  Hemoglobin 13.0 - 17.0 g/dL 16.0 15.6   Hematocrit 39.0 - 52.0 % 47.0 44.0  Platelets 150 - 400 K/uL - 262   Lipid Panel  No results found for: CHOL, TRIG, HDL, CHOLHDL, VLDL, LDLCALC, LDLDIRECT HEMOGLOBIN A1C No results found for: HGBA1C, MPG TSH No results for input(s): TSH in the last 8760 hours.  PRN Meds:. Medications Discontinued  During This Encounter  Medication Reason  . gabapentin (NEURONTIN) 300 MG capsule Error  . amLODIPine-Valsartan-HCTZ 5-160-25 MG TABS Reorder   Current Meds  Medication Sig  . amLODIPine-Valsartan-HCTZ 5-160-25 MG TABS Take 1 tablet by mouth daily.  Marland Kitchen aspirin EC 81 MG tablet Take 81 mg by mouth daily.  Marland Kitchen buPROPion (WELLBUTRIN XL) 300 MG 24 hr tablet Take 300 mg by mouth 2 (two) times daily.  . butalbital-acetaminophen-caffeine (FIORICET) 50-325-40 MG tablet Take by mouth daily as needed for headache.  . clonazePAM (KLONOPIN) 0.5 MG tablet Take 0.5 mg by mouth 2 (two) times daily as needed for anxiety.  . Oxycodone HCl 10 MG TABS Take 10 mg by mouth every 4 (four) hours as needed for pain.   . rosuvastatin (CRESTOR) 10 MG tablet Take 1 tablet (10 mg total) by mouth daily.  . [DISCONTINUED] amLODIPine-Valsartan-HCTZ 5-160-25 MG TABS TAKE 1 TABLET BY MOUTH DAILY    Cardiac Studies:   Lexiscan Myoview stress test 01/11/2019: Lexiscan stress test was performed. Stress EKG is non-diagnostic, as this is pharmacological stress test. Normal size left ventricle. Stress LV EF is normal 57%.  SPECT stress and rest images show small area of mild intensity, decreased tracer uptake in anterior apical myocardium. This likely represents tissue attenuation. Low risk study.   Echocardiogram 01/14/2019: Left ventricle cavity is normal in size. Moderate concentric hypertrophy of the left ventricle. Normal LV systolic function with EF 55%. Normal global wall motion. Doppler evidence of grade I (impaired) diastolic dysfunction, normal LAP.  Mild pulmonic regurgitation. Insignificant pericardial effusion.  Normal right atrial pressure.   Assessment:     ICD-10-CM   1. Coronary artery calcification seen on CT scan  I25.10   2. Essential hypertension  I10   3. Mixed hyperlipidemia  E78.2   4. Tobacco use disorder  F17.200   5. Moderate obesity  E66.8     EKG 12/23/2018: Normal sinus rhythm at 83 bpm, normal axis, diffuse non-specific T wave abnormality.  Recommendations:   I discussed recently obtained stress test results with the patient, considered low risk study.  I have also discussed his echocardiogram results, mild blood pressure changes are noted with moderate LVH and grade 1 diastolic dysfunction, but normal LVEF.  I recommended continued aggressive medical management in view of coronary calcification on CT scan.  He was started on rosuvastatin by his PCP, urged him to continue with this.  He will need repeat lipids in the next few months for surveillance.  Would recommend LDL around 70 for aggressive management.  I have stressed the importance of other risk factor modifications including weight loss, blood pressure control, and quitting smoking.  He has been working on weight loss and has lost 2 pounds since last seen by [place], encouraged him to continue with this.  He is feeling much better with starting amlodipine-valsartan-hydrochlorothiazide combination and blood pressure has improved, advised him to continue the same.   I have stressed again the importance of quitting smoking; however, patient states that he is not interested in quitting at this time. Encouraged him to contact for any chest pain or shortness of breath, we will see him back on a PRN basis. Continue to follow up with PCP for refills and further risk factor modification.   Miquel Dunn, MSN, APRN, FNP-C [facility]. [place] Office: [phone] Fax: [phone]

## 2019-01-25 ENCOUNTER — Telehealth: Payer: Self-pay | Admitting: Gastroenterology

## 2019-01-25 NOTE — Telephone Encounter (Signed)
Called and spoke to pharmacy.  They received and ran the voucher.  He will pay $50. They will call him and make him aware.

## 2019-01-25 NOTE — Telephone Encounter (Signed)
Called and spoke to pharmacy. Suprep Not covered by his insurance. Would be $145.  Faxed Pay No More Than $50 voucher to [facility] on [address].

## 2019-01-29 ENCOUNTER — Telehealth: Payer: Self-pay | Admitting: Gastroenterology

## 2019-01-29 NOTE — Telephone Encounter (Signed)

## 2019-02-01 ENCOUNTER — Encounter: Payer: Self-pay | Admitting: Gastroenterology

## 2019-02-01 ENCOUNTER — Ambulatory Visit (AMBULATORY_SURGERY_CENTER): Payer: Medicare Other | Admitting: Gastroenterology

## 2019-02-01 ENCOUNTER — Other Ambulatory Visit: Payer: Self-pay

## 2019-02-01 ENCOUNTER — Other Ambulatory Visit: Payer: Self-pay | Admitting: Gastroenterology

## 2019-02-01 VITALS — BP 147/72 | HR 79 | Temp 97.7°F | Resp 18 | Ht 72.0 in | Wt 258.0 lb

## 2019-02-01 DIAGNOSIS — D120 Benign neoplasm of cecum: Secondary | ICD-10-CM | POA: Diagnosis not present

## 2019-02-01 DIAGNOSIS — Z1211 Encounter for screening for malignant neoplasm of colon: Secondary | ICD-10-CM | POA: Diagnosis not present

## 2019-02-01 DIAGNOSIS — D123 Benign neoplasm of transverse colon: Secondary | ICD-10-CM

## 2019-02-01 DIAGNOSIS — Z86010 Personal history of colonic polyps: Secondary | ICD-10-CM

## 2019-02-01 MED ORDER — SODIUM CHLORIDE 0.9 % IV SOLN: 500.0000 mL | Freq: Once | INTRAVENOUS | Status: DC

## 2019-02-01 NOTE — Progress Notes (Signed)
A and O x3. Report to RN. Tolerated MAC anesthesia well.

## 2019-02-01 NOTE — Progress Notes (Signed)
Temperature taken by K.A., VS taken by C.W. 

## 2019-02-01 NOTE — Progress Notes (Signed)
Pt's states no medical or surgical changes since previsit or office visit. 

## 2019-02-01 NOTE — Progress Notes (Signed)
Called to room to assist during endoscopic procedure.  Patient ID and intended procedure confirmed with present staff. Received instructions for my participation in the procedure from the performing physician.  

## 2019-02-01 NOTE — Op Note (Signed)
[place] Patient Name: Thomas Macdonald Procedure Date: 02/01/2019 9:27 AM MRN: [id number] Endoscopist: Remo Lipps P. Havery Moros , MD Age: 63 Referring MD:  Date of Birth: 08-07-1955 Gender: Male Account #: [id number] Procedure:                Colonoscopy Indications:              High risk colon cancer surveillance: Personal                            history of colonic polyps (multiple polyps removed                            2017) Medicines:                Monitored Anesthesia Care Procedure:                Pre-Anesthesia Assessment:                           - Prior to the procedure, a History and Physical                            was performed, and patient medications and                            allergies were reviewed. The patient's tolerance of                            previous anesthesia was also reviewed. The risks                            and benefits of the procedure and the sedation                            options and risks were discussed with the patient.                            All questions were answered, and informed consent                            was obtained. Prior Anticoagulants: The patient has                            taken no previous anticoagulant or antiplatelet                            agents. ASA Grade Assessment: II - A patient with                            mild systemic disease. After reviewing the risks                            and benefits, the patient was deemed in  satisfactory condition to undergo the procedure.                           After obtaining informed consent, the colonoscope                            was passed under direct vision. Throughout the                            procedure, the patient's blood pressure, pulse, and                            oxygen saturations were monitored continuously. The                            Colonoscope was introduced through the anus and                         advanced to the the cecum, identified by                            appendiceal orifice and ileocecal valve. The                            colonoscopy was performed without difficulty. The                            patient tolerated the procedure well. The quality                            of the bowel preparation was adequate. The                            ileocecal valve, appendiceal orifice, and rectum                            were photographed. Scope In: 9:39:05 AM Scope Out: 10:03:02 AM Scope Withdrawal Time: 0 hours 21 minutes 7 seconds  Total Procedure Duration: 0 hours 23 minutes 57 seconds  Findings:                 The perianal and digital rectal examinations were                            normal.                           A 3 mm polyp was found in the cecum. The polyp was                            sessile. The polyp was removed with a cold snare.                            Resection and retrieval were complete.  Three sessile polyps were found in the transverse                            colon. The polyps were 3 to 4 mm in size. These                            polyps were removed with a cold snare. Resection                            and retrieval were complete.                           Multiple small-mouthed diverticula were found in                            the sigmoid colon.                           Internal hemorrhoids were found during                            retroflexion. The hemorrhoids were small.                           There were multiple suspected hyperplastic polyps                            in the sigmoid colon and rectum, previously sampled                            on last exam and c/w hyperplastic polyps, not                            sampled again on this exam - no interval changes or                            lesions concerning for adenomas. The exam was                            otherwise  without abnormality. Complications:            No immediate complications. Estimated blood loss:                            Minimal. Estimated Blood Loss:     Estimated blood loss was minimal. Impression:               - One 3 mm polyp in the cecum, removed with a cold                            snare. Resected and retrieved.                           - Three 3 to 4 mm polyps in the transverse colon,  removed with a cold snare. Resected and retrieved.                           - Diverticulosis in the sigmoid colon.                           - Internal hemorrhoids.                           - Multiple benign left sided hyperplastic polyps.                           - The examination was otherwise normal. Recommendation:           - Patient has a contact number available for                            emergencies. The signs and symptoms of potential                            delayed complications were discussed with the                            patient. Return to normal activities tomorrow.                            Written discharge instructions were provided to the                            patient.                           - Resume previous diet.                           - Continue present medications.                           - Await pathology results. Remo Lipps P. Corrigan Kretschmer, MD 02/01/2019 10:09:26 AM This report has been signed electronically.

## 2019-02-01 NOTE — Patient Instructions (Signed)
Please read handouts. Continue present medications. Await pathology results.       YOU HAD AN ENDOSCOPIC PROCEDURE TODAY AT [facility]:   Refer to the procedure report that was given to you for any specific questions about what was found during the examination.  If the procedure report does not answer your questions, please call your gastroenterologist to clarify.  If you requested that your care partner not be given the details of your procedure findings, then the procedure report has been included in a sealed envelope for you to review at your convenience later.  YOU SHOULD EXPECT: Some feelings of bloating in the abdomen. Passage of more gas than usual.  Walking can help get rid of the air that was put into your GI tract during the procedure and reduce the bloating. If you had a lower endoscopy (such as a colonoscopy or flexible sigmoidoscopy) you may notice spotting of blood in your stool or on the toilet paper. If you underwent a bowel prep for your procedure, you may not have a normal bowel movement for a few days.  Please Note:  You might notice some irritation and congestion in your nose or some drainage.  This is from the oxygen used during your procedure.  There is no need for concern and it should clear up in a day or so.  SYMPTOMS TO REPORT IMMEDIATELY:   Following lower endoscopy (colonoscopy or flexible sigmoidoscopy):  Excessive amounts of blood in the stool  Significant tenderness or worsening of abdominal pains  Swelling of the abdomen that is new, acute  Fever of 100F or higher   For urgent or emergent issues, a gastroenterologist can be reached at any hour by calling [phone].   DIET:  We do recommend a small meal at first, but then you may proceed to your regular diet.  Drink plenty of fluids but you should avoid alcoholic beverages for 24 hours.  ACTIVITY:  You should plan to take it easy for the rest of today and you should NOT DRIVE or use  heavy machinery until tomorrow (because of the sedation medicines used during the test).    FOLLOW UP: Our staff will call the number listed on your records 48-72 hours following your procedure to check on you and address any questions or concerns that you may have regarding the information given to you following your procedure. If we do not reach you, we will leave a message.  We will attempt to reach you two times.  During this call, we will ask if you have developed any symptoms of COVID 19. If you develop any symptoms (ie: fever, flu-like symptoms, shortness of breath, cough etc.) before then, please call [phone].  If you test positive for Covid 19 in the 2 weeks post procedure, please call and report this information to [place].    If any biopsies were taken you will be contacted by phone or by letter within the next 1-3 weeks.  Please call us at [phone] if you have not heard about the biopsies in 3 weeks.    SIGNATURES/CONFIDENTIALITY: You and/or your care partner have signed paperwork which will be entered into your electronic medical record.  These signatures attest to the fact that that the information above on your After Visit Summary has been reviewed and is understood.  Full responsibility of the confidentiality of this discharge information lies with you and/or your care-partner.

## 2019-02-03 ENCOUNTER — Telehealth: Payer: Self-pay

## 2019-02-03 MED FILL — oxyCODONE HCL 10 MG TABS: 10 | 30 days supply | Qty: 180 | Fill #0

## 2019-02-03 NOTE — Telephone Encounter (Signed)
  Follow up Call-  Call back number 02/01/2019  Post procedure Call Back phone  # [phone]  Permission to leave phone message Yes  Some recent data might be hidden     Patient questions:  Do you have a fever, pain , or abdominal swelling? No. Pain Score  0 *  Have you tolerated food without any problems? Yes.    Have you been able to return to your normal activities? Yes.    Do you have any questions about your discharge instructions: Diet   No. Medications  No. Follow up visit  No.  Do you have questions or concerns about your Care? No.  Actions: * If pain score is 4 or above: No action needed, pain <4.  1. Have you developed a fever since your procedure? no  2.   Have you had an respiratory symptoms (SOB or cough) since your procedure? no  3.   Have you tested positive for COVID 19 since your procedure no  4.   Have you had any family members/close contacts diagnosed with the COVID 19 since your procedure?  no   If yes to any of these questions please route to Joylene John, RN and Alphonsa Gin, Therapist, sports.

## 2019-02-04 ENCOUNTER — Encounter: Payer: Self-pay | Admitting: Gastroenterology

## 2019-02-08 MED FILL — GABAPENTIN 300 MG CAPSULE: 300 | 30 days supply | Qty: 90 | Fill #2

## 2019-03-08 MED FILL — oxyCODONE HCL 10 MG TABS: 10 | 30 days supply | Qty: 180 | Fill #0

## 2019-03-08 MED FILL — GABAPENTIN 300 MG CAPSULE: 300 | 30 days supply | Qty: 90 | Fill #3

## 2019-04-13 MED FILL — oxyCODONE HCL 10 MG TABS: 10 | 30 days supply | Qty: 180 | Fill #0

## 2019-04-28 DIAGNOSIS — E1169 Type 2 diabetes mellitus with other specified complication: Secondary | ICD-10-CM | POA: Diagnosis not present

## 2019-04-28 DIAGNOSIS — I10 Essential (primary) hypertension: Secondary | ICD-10-CM | POA: Diagnosis not present

## 2019-04-29 ENCOUNTER — Telehealth: Payer: Self-pay

## 2019-04-29 NOTE — Telephone Encounter (Signed)
Pharmacy says that amLODIPine-Valsartan-HCTZ has been discontinued. Please advise.

## 2019-04-29 NOTE — Telephone Encounter (Signed)
Its on back order, pt has enough to hold him over.

## 2019-04-29 NOTE — Telephone Encounter (Signed)
First I have heard of that. We can give him 3 seperate scripts, with no refills. He is PRN with [place], so may need to come from his PCP

## 2019-04-30 DIAGNOSIS — Z720 Tobacco use: Secondary | ICD-10-CM | POA: Diagnosis not present

## 2019-04-30 DIAGNOSIS — F331 Major depressive disorder, recurrent, moderate: Secondary | ICD-10-CM | POA: Diagnosis not present

## 2019-04-30 DIAGNOSIS — E1169 Type 2 diabetes mellitus with other specified complication: Secondary | ICD-10-CM | POA: Diagnosis not present

## 2019-05-11 MED FILL — oxyCODONE HCL 10 MG TABS: 10 | 30 days supply | Qty: 180 | Fill #0

## 2019-05-11 MED FILL — GABAPENTIN 300 MG CAPSULE: 300 | 30 days supply | Qty: 90 | Fill #2

## 2019-06-04 ENCOUNTER — Telehealth: Payer: Self-pay

## 2019-06-04 NOTE — Telephone Encounter (Signed)
Pharmacy advised that Amlodipine combo is on backorder. Please advise.//ah

## 2019-06-04 NOTE — Telephone Encounter (Signed)
Please ask them to call his PCP, he is PRN with [place]

## 2019-06-04 NOTE — Telephone Encounter (Signed)
Pt aware.

## 2019-06-14 MED FILL — oxyCODONE HCL 10 MG TABS: 10 | 30 days supply | Qty: 180 | Fill #0

## 2019-06-21 DIAGNOSIS — L218 Other seborrheic dermatitis: Secondary | ICD-10-CM | POA: Diagnosis not present

## 2019-06-21 DIAGNOSIS — L738 Other specified follicular disorders: Secondary | ICD-10-CM | POA: Diagnosis not present

## 2019-07-12 MED FILL — GABAPENTIN 300 MG CAPSULE: 300 | 30 days supply | Qty: 90 | Fill #0

## 2019-07-12 MED FILL — DICLOFENAC SODIUM 1 % GEL: 1 | 30 days supply | Qty: 500 | Fill #0

## 2019-07-15 MED FILL — oxyCODONE HCL 10 MG TABS: 10 | 30 days supply | Qty: 180 | Fill #0

## 2019-08-17 MED FILL — oxyCODONE HCL 10 MG TABS: 10 | 30 days supply | Qty: 180 | Fill #0

## 2019-09-03 DIAGNOSIS — Z0000 Encounter for general adult medical examination without abnormal findings: Secondary | ICD-10-CM | POA: Diagnosis not present

## 2019-09-03 DIAGNOSIS — E7849 Other hyperlipidemia: Secondary | ICD-10-CM | POA: Diagnosis not present

## 2019-09-03 DIAGNOSIS — E291 Testicular hypofunction: Secondary | ICD-10-CM | POA: Diagnosis not present

## 2019-09-03 DIAGNOSIS — E1165 Type 2 diabetes mellitus with hyperglycemia: Secondary | ICD-10-CM | POA: Diagnosis not present

## 2019-09-06 DIAGNOSIS — I10 Essential (primary) hypertension: Secondary | ICD-10-CM | POA: Diagnosis not present

## 2019-09-06 DIAGNOSIS — R82998 Other abnormal findings in urine: Secondary | ICD-10-CM | POA: Diagnosis not present

## 2019-09-10 DIAGNOSIS — Z1331 Encounter for screening for depression: Secondary | ICD-10-CM | POA: Diagnosis not present

## 2019-09-10 DIAGNOSIS — G4733 Obstructive sleep apnea (adult) (pediatric): Secondary | ICD-10-CM | POA: Diagnosis not present

## 2019-09-10 DIAGNOSIS — I119 Hypertensive heart disease without heart failure: Secondary | ICD-10-CM | POA: Diagnosis not present

## 2019-09-10 DIAGNOSIS — E785 Hyperlipidemia, unspecified: Secondary | ICD-10-CM | POA: Diagnosis not present

## 2019-09-10 DIAGNOSIS — Z0000 Encounter for general adult medical examination without abnormal findings: Secondary | ICD-10-CM | POA: Diagnosis not present

## 2019-10-05 MED FILL — GABAPENTIN 300 MG CAPSULE: 300 | 30 days supply | Qty: 90 | Fill #1

## 2019-10-25 DIAGNOSIS — I119 Hypertensive heart disease without heart failure: Secondary | ICD-10-CM | POA: Diagnosis not present

## 2019-10-25 DIAGNOSIS — R5383 Other fatigue: Secondary | ICD-10-CM | POA: Diagnosis not present

## 2019-10-25 DIAGNOSIS — E291 Testicular hypofunction: Secondary | ICD-10-CM | POA: Diagnosis not present

## 2019-10-25 DIAGNOSIS — E119 Type 2 diabetes mellitus without complications: Secondary | ICD-10-CM | POA: Diagnosis not present

## 2019-10-25 DIAGNOSIS — E1165 Type 2 diabetes mellitus with hyperglycemia: Secondary | ICD-10-CM | POA: Diagnosis not present

## 2019-11-01 MED FILL — oxyCODONE HCL 10 MG TABS: 10 | 30 days supply | Qty: 180 | Fill #0

## 2019-11-01 MED FILL — GABAPENTIN 300 MG CAPSULE: 300 | 30 days supply | Qty: 90 | Fill #2

## 2019-11-08 MED FILL — DULoxetine HCL 30 MG CPEP: 30 | 30 days supply | Qty: 30 | Fill #0

## 2019-11-30 MED FILL — GABAPENTIN 300 MG CAPSULE: 300 | 30 days supply | Qty: 90 | Fill #0

## 2019-11-30 MED FILL — oxyCODONE HCL 10 MG TABS: 10 | 30 days supply | Qty: 180 | Fill #0

## 2019-12-21 DIAGNOSIS — F331 Major depressive disorder, recurrent, moderate: Secondary | ICD-10-CM | POA: Diagnosis not present

## 2019-12-21 DIAGNOSIS — E785 Hyperlipidemia, unspecified: Secondary | ICD-10-CM | POA: Diagnosis not present

## 2019-12-21 DIAGNOSIS — E1169 Type 2 diabetes mellitus with other specified complication: Secondary | ICD-10-CM | POA: Diagnosis not present

## 2020-01-03 MED FILL — GABAPENTIN 300 MG CAPSULE: 300 | 30 days supply | Qty: 90 | Fill #1

## 2020-01-03 MED FILL — oxyCODONE HCL 10 MG TABS: 10 | 30 days supply | Qty: 180 | Fill #0

## 2020-01-06 MED FILL — BUTALBITAL-ACETAMINOPHEN 50: 50-325 | 15 days supply | Qty: 30 | Fill #0

## 2020-01-20 DIAGNOSIS — L820 Inflamed seborrheic keratosis: Secondary | ICD-10-CM | POA: Diagnosis not present

## 2020-01-20 DIAGNOSIS — L821 Other seborrheic keratosis: Secondary | ICD-10-CM | POA: Diagnosis not present

## 2020-01-20 DIAGNOSIS — B078 Other viral warts: Secondary | ICD-10-CM | POA: Diagnosis not present

## 2020-02-04 MED FILL — GABAPENTIN 300 MG CAPSULE: 300 | 30 days supply | Qty: 90 | Fill #2

## 2020-02-04 MED FILL — oxyCODONE HCL 10 MG TABS: 10 | 30 days supply | Qty: 180 | Fill #0

## 2020-03-02 ENCOUNTER — Other Ambulatory Visit (HOSPITAL_COMMUNITY): Payer: Self-pay | Admitting: Physical Medicine and Rehabilitation

## 2020-03-02 MED FILL — GABAPENTIN 400 MG CAPSULE: 400 | 30 days supply | Qty: 90 | Fill #0

## 2020-03-03 MED FILL — oxyCODONE HCL 10 MG TABS: 10 | 30 days supply | Qty: 180 | Fill #0

## 2020-03-14 DIAGNOSIS — I10 Essential (primary) hypertension: Secondary | ICD-10-CM | POA: Diagnosis not present

## 2020-03-14 DIAGNOSIS — E1169 Type 2 diabetes mellitus with other specified complication: Secondary | ICD-10-CM | POA: Diagnosis not present

## 2020-03-14 DIAGNOSIS — E669 Obesity, unspecified: Secondary | ICD-10-CM | POA: Diagnosis not present

## 2020-03-14 DIAGNOSIS — F17200 Nicotine dependence, unspecified, uncomplicated: Secondary | ICD-10-CM | POA: Diagnosis not present

## 2020-04-11 MED FILL — GABAPENTIN 400 MG CAPSULE: 400 | 30 days supply | Qty: 90 | Fill #1

## 2020-04-11 MED FILL — oxyCODONE HCL 10 MG TABS: 10 | 30 days supply | Qty: 180 | Fill #0

## 2020-05-01 ENCOUNTER — Other Ambulatory Visit (HOSPITAL_COMMUNITY): Payer: Self-pay | Admitting: Physical Medicine and Rehabilitation

## 2020-05-01 MED FILL — GABAPENTIN 400 MG CAPSULE: 400 | 30 days supply | Qty: 120 | Fill #0

## 2020-05-15 MED FILL — oxyCODONE HCL 10 MG TABS: 10 | 30 days supply | Qty: 180 | Fill #0

## 2020-06-16 MED FILL — GABAPENTIN 400 MG CAPSULE: 400 | 30 days supply | Qty: 120 | Fill #1

## 2020-06-16 MED FILL — oxyCODONE HCL 10 MG TABS: 10 | 30 days supply | Qty: 180 | Fill #0

## 2020-07-03 ENCOUNTER — Other Ambulatory Visit (HOSPITAL_COMMUNITY): Payer: Self-pay | Admitting: Physical Medicine and Rehabilitation

## 2020-07-13 MED FILL — GABAPENTIN 400 MG CAPSULE: 400 | 30 days supply | Qty: 120 | Fill #2

## 2020-07-14 MED FILL — oxyCODONE HCL 10 MG TABS: 10 | 30 days supply | Qty: 180 | Fill #0

## 2020-07-31 ENCOUNTER — Other Ambulatory Visit (HOSPITAL_COMMUNITY): Payer: Self-pay

## 2020-08-16 ENCOUNTER — Other Ambulatory Visit (HOSPITAL_COMMUNITY): Payer: Self-pay

## 2020-08-16 MED ORDER — BUTALBITAL-ACETAMINOPHEN 50-325 MG PO TABS
50.0000 mg | ORAL_TABLET | Freq: Two times a day (BID) | ORAL | 0 refills | Status: DC | PRN
  Filled 2020-08-16: qty 30, 15d supply, fill #0

## 2020-08-16 MED FILL — Oxycodone HCl Tab 10 MG: ORAL | 30 days supply | Qty: 180 | Fill #0 | Status: AC

## 2020-08-17 ENCOUNTER — Other Ambulatory Visit (HOSPITAL_COMMUNITY): Payer: Self-pay

## 2020-08-18 ENCOUNTER — Other Ambulatory Visit (HOSPITAL_COMMUNITY): Payer: Self-pay

## 2020-08-18 DIAGNOSIS — H903 Sensorineural hearing loss, bilateral: Secondary | ICD-10-CM | POA: Diagnosis not present

## 2020-08-18 DIAGNOSIS — I10 Essential (primary) hypertension: Secondary | ICD-10-CM | POA: Diagnosis not present

## 2020-08-22 DIAGNOSIS — F331 Major depressive disorder, recurrent, moderate: Secondary | ICD-10-CM | POA: Diagnosis not present

## 2020-08-22 DIAGNOSIS — F419 Anxiety disorder, unspecified: Secondary | ICD-10-CM | POA: Diagnosis not present

## 2020-08-22 DIAGNOSIS — G8921 Chronic pain due to trauma: Secondary | ICD-10-CM | POA: Diagnosis not present

## 2020-08-22 DIAGNOSIS — R002 Palpitations: Secondary | ICD-10-CM | POA: Diagnosis not present

## 2020-08-24 ENCOUNTER — Other Ambulatory Visit (HOSPITAL_COMMUNITY): Payer: Self-pay

## 2020-08-29 ENCOUNTER — Other Ambulatory Visit (HOSPITAL_COMMUNITY): Payer: Self-pay

## 2020-08-29 MED ORDER — BUTALBITAL-ACETAMINOPHEN 50-325 MG PO TABS
1.0000 | ORAL_TABLET | Freq: Two times a day (BID) | ORAL | 1 refills | Status: DC | PRN
  Filled 2020-08-29: qty 30, 15d supply, fill #0

## 2020-08-29 MED ORDER — GABAPENTIN 100 MG PO CAPS
ORAL_CAPSULE | Freq: Three times a day (TID) | ORAL | 3 refills | Status: DC
  Filled 2020-08-29: qty 90, 30d supply, fill #0

## 2020-08-29 MED ORDER — OXYCODONE HCL 10 MG PO TABS
ORAL_TABLET | ORAL | 0 refills | Status: DC
  Filled 2020-09-21: qty 180, 30d supply, fill #0

## 2020-08-29 MED ORDER — OXYCODONE HCL 10 MG PO TABS
ORAL_TABLET | ORAL | 0 refills | Status: DC
  Filled 2020-10-26: qty 180, 30d supply, fill #0

## 2020-08-30 ENCOUNTER — Other Ambulatory Visit: Payer: Self-pay

## 2020-08-30 ENCOUNTER — Ambulatory Visit
Admission: RE | Admit: 2020-08-30 | Discharge: 2020-08-30 | Disposition: A | Discharge status: Home / Self Care | Payer: Medicare Other | Source: Ambulatory Visit | Attending: Registered Nurse | Admitting: Registered Nurse

## 2020-08-30 ENCOUNTER — Other Ambulatory Visit (HOSPITAL_COMMUNITY): Payer: Self-pay

## 2020-08-30 ENCOUNTER — Other Ambulatory Visit: Payer: Self-pay | Admitting: Registered Nurse

## 2020-08-30 DIAGNOSIS — R634 Abnormal weight loss: Secondary | ICD-10-CM | POA: Diagnosis not present

## 2020-08-30 DIAGNOSIS — R0989 Other specified symptoms and signs involving the circulatory and respiratory systems: Secondary | ICD-10-CM

## 2020-08-30 DIAGNOSIS — I119 Hypertensive heart disease without heart failure: Secondary | ICD-10-CM | POA: Diagnosis not present

## 2020-08-30 DIAGNOSIS — R109 Unspecified abdominal pain: Secondary | ICD-10-CM

## 2020-08-30 DIAGNOSIS — R002 Palpitations: Secondary | ICD-10-CM | POA: Diagnosis not present

## 2020-08-30 DIAGNOSIS — R1030 Lower abdominal pain, unspecified: Secondary | ICD-10-CM | POA: Diagnosis not present

## 2020-08-30 DIAGNOSIS — F331 Major depressive disorder, recurrent, moderate: Secondary | ICD-10-CM | POA: Diagnosis not present

## 2020-08-30 DIAGNOSIS — F419 Anxiety disorder, unspecified: Secondary | ICD-10-CM | POA: Diagnosis not present

## 2020-08-30 DIAGNOSIS — E278 Other specified disorders of adrenal gland: Secondary | ICD-10-CM | POA: Diagnosis not present

## 2020-08-30 MED ORDER — IOPAMIDOL (ISOVUE-300) INJECTION 61%
100.0000 mL | Freq: Once | INTRAVENOUS | Status: AC | PRN
  Administered 2020-08-30: 100 mL via INTRAVENOUS

## 2020-09-07 DIAGNOSIS — R5383 Other fatigue: Secondary | ICD-10-CM | POA: Diagnosis not present

## 2020-09-07 DIAGNOSIS — E785 Hyperlipidemia, unspecified: Secondary | ICD-10-CM | POA: Diagnosis not present

## 2020-09-20 DIAGNOSIS — E291 Testicular hypofunction: Secondary | ICD-10-CM | POA: Diagnosis not present

## 2020-09-20 DIAGNOSIS — Z125 Encounter for screening for malignant neoplasm of prostate: Secondary | ICD-10-CM | POA: Diagnosis not present

## 2020-09-20 DIAGNOSIS — E785 Hyperlipidemia, unspecified: Secondary | ICD-10-CM | POA: Diagnosis not present

## 2020-09-21 ENCOUNTER — Other Ambulatory Visit (HOSPITAL_COMMUNITY): Payer: Self-pay

## 2020-09-25 DIAGNOSIS — R82998 Other abnormal findings in urine: Secondary | ICD-10-CM | POA: Diagnosis not present

## 2020-09-25 DIAGNOSIS — Z1212 Encounter for screening for malignant neoplasm of rectum: Secondary | ICD-10-CM | POA: Diagnosis not present

## 2020-09-25 DIAGNOSIS — E1165 Type 2 diabetes mellitus with hyperglycemia: Secondary | ICD-10-CM | POA: Diagnosis not present

## 2020-09-27 DIAGNOSIS — R5383 Other fatigue: Secondary | ICD-10-CM | POA: Diagnosis not present

## 2020-10-26 ENCOUNTER — Other Ambulatory Visit (HOSPITAL_COMMUNITY): Payer: Self-pay

## 2020-10-26 DIAGNOSIS — F331 Major depressive disorder, recurrent, moderate: Secondary | ICD-10-CM | POA: Diagnosis not present

## 2020-10-26 DIAGNOSIS — F419 Anxiety disorder, unspecified: Secondary | ICD-10-CM | POA: Diagnosis not present

## 2020-10-26 MED ORDER — OXYCODONE HCL 10 MG PO TABS
ORAL_TABLET | ORAL | 0 refills | Status: DC
  Filled 2020-11-29: qty 180, 30d supply, fill #0

## 2020-10-26 MED ORDER — OXYCODONE HCL 10 MG PO TABS
ORAL_TABLET | ORAL | 0 refills | Status: DC
  Filled 2020-10-26: qty 180, 30d supply, fill #0

## 2020-10-26 MED ORDER — BUTALBITAL-ACETAMINOPHEN 50-325 MG PO TABS
ORAL_TABLET | ORAL | 1 refills | Status: DC
  Filled 2020-10-26: qty 30, 15d supply, fill #0

## 2020-11-29 ENCOUNTER — Other Ambulatory Visit (HOSPITAL_COMMUNITY): Payer: Self-pay

## 2020-12-27 ENCOUNTER — Other Ambulatory Visit (HOSPITAL_COMMUNITY): Payer: Self-pay

## 2020-12-27 MED ORDER — BUTALBITAL-APAP-CAFFEINE 50-325-40 MG PO TABS
ORAL_TABLET | ORAL | 1 refills | Status: DC
  Filled 2020-12-27: qty 15, 7d supply, fill #0

## 2020-12-27 MED ORDER — OXYCODONE HCL 10 MG PO TABS
ORAL_TABLET | ORAL | 0 refills | Status: DC
  Filled 2021-02-05: qty 180, 30d supply, fill #0

## 2020-12-27 MED ORDER — BUTALBITAL-ACETAMINOPHEN 50-325 MG PO TABS
ORAL_TABLET | ORAL | 1 refills | Status: DC
  Filled 2020-12-27: qty 30, 15d supply, fill #0

## 2020-12-27 MED ORDER — OXYCODONE HCL 10 MG PO TABS
ORAL_TABLET | ORAL | 0 refills | Status: DC
  Filled 2020-12-27: qty 180, 30d supply, fill #0

## 2021-01-03 DIAGNOSIS — E1169 Type 2 diabetes mellitus with other specified complication: Secondary | ICD-10-CM | POA: Diagnosis not present

## 2021-01-03 DIAGNOSIS — F419 Anxiety disorder, unspecified: Secondary | ICD-10-CM | POA: Diagnosis not present

## 2021-01-03 DIAGNOSIS — F331 Major depressive disorder, recurrent, moderate: Secondary | ICD-10-CM | POA: Diagnosis not present

## 2021-02-05 ENCOUNTER — Other Ambulatory Visit (HOSPITAL_COMMUNITY): Payer: Self-pay

## 2021-02-21 ENCOUNTER — Other Ambulatory Visit (HOSPITAL_COMMUNITY): Payer: Self-pay

## 2021-02-21 MED ORDER — OXYCODONE HCL 10 MG PO TABS
ORAL_TABLET | ORAL | 0 refills | Status: DC
  Filled 2021-04-11: qty 180, 30d supply, fill #0

## 2021-02-21 MED ORDER — OXYCODONE HCL 10 MG PO TABS
ORAL_TABLET | ORAL | 0 refills | Status: DC
  Filled 2021-03-12: qty 180, 30d supply, fill #0

## 2021-02-21 MED ORDER — NALOXONE HCL 4 MG/0.1ML NA LIQD
NASAL | 1 refills | Status: DC
  Filled 2021-02-21: qty 2, 30d supply, fill #0

## 2021-02-22 ENCOUNTER — Other Ambulatory Visit (HOSPITAL_COMMUNITY): Payer: Self-pay

## 2021-03-12 ENCOUNTER — Other Ambulatory Visit (HOSPITAL_COMMUNITY): Payer: Self-pay

## 2021-04-11 ENCOUNTER — Other Ambulatory Visit (HOSPITAL_COMMUNITY): Payer: Self-pay

## 2021-04-11 DIAGNOSIS — F331 Major depressive disorder, recurrent, moderate: Secondary | ICD-10-CM | POA: Diagnosis not present

## 2021-04-11 DIAGNOSIS — E1169 Type 2 diabetes mellitus with other specified complication: Secondary | ICD-10-CM | POA: Diagnosis not present

## 2021-04-11 DIAGNOSIS — F419 Anxiety disorder, unspecified: Secondary | ICD-10-CM | POA: Diagnosis not present

## 2021-04-18 ENCOUNTER — Other Ambulatory Visit (HOSPITAL_COMMUNITY): Payer: Self-pay

## 2021-04-18 MED ORDER — OXYCODONE HCL 10 MG PO TABS
10.0000 mg | ORAL_TABLET | ORAL | 0 refills | Status: DC | PRN
  Filled 2021-06-12: qty 180, 30d supply, fill #0

## 2021-04-18 MED ORDER — OXYCODONE HCL 10 MG PO TABS
10.0000 mg | ORAL_TABLET | ORAL | 0 refills | Status: DC | PRN
  Filled 2021-05-15: qty 180, 30d supply, fill #0

## 2021-05-15 ENCOUNTER — Other Ambulatory Visit (HOSPITAL_COMMUNITY): Payer: Self-pay

## 2021-06-12 ENCOUNTER — Other Ambulatory Visit (HOSPITAL_COMMUNITY): Payer: Self-pay

## 2021-06-14 ENCOUNTER — Other Ambulatory Visit (HOSPITAL_COMMUNITY): Payer: Self-pay

## 2021-06-14 MED ORDER — BUTALBITAL-ACETAMINOPHEN 50-325 MG PO TABS
ORAL_TABLET | ORAL | 1 refills | Status: DC
  Filled 2021-06-14: qty 30, 15d supply, fill #0

## 2021-06-14 MED ORDER — OXYCODONE HCL 10 MG PO TABS
ORAL_TABLET | ORAL | 0 refills | Status: DC
  Filled 2021-07-17: qty 180, 30d supply, fill #0

## 2021-07-12 DIAGNOSIS — J40 Bronchitis, not specified as acute or chronic: Secondary | ICD-10-CM | POA: Diagnosis not present

## 2021-07-12 DIAGNOSIS — J0100 Acute maxillary sinusitis, unspecified: Secondary | ICD-10-CM | POA: Diagnosis not present

## 2021-07-17 ENCOUNTER — Other Ambulatory Visit (HOSPITAL_COMMUNITY): Payer: Self-pay

## 2021-08-09 ENCOUNTER — Other Ambulatory Visit (HOSPITAL_COMMUNITY): Payer: Self-pay

## 2021-08-09 MED ORDER — DICLOFENAC SODIUM 1 % EX GEL
CUTANEOUS | 5 refills | Status: AC
  Filled 2021-08-09: qty 500, 31d supply, fill #0

## 2021-08-09 MED ORDER — OXYCODONE HCL 10 MG PO TABS
ORAL_TABLET | ORAL | 0 refills | Status: DC
  Filled 2021-09-24: qty 180, 30d supply, fill #0

## 2021-08-09 MED ORDER — OXYCODONE HCL 10 MG PO TABS
ORAL_TABLET | ORAL | 0 refills | Status: DC
  Filled 2021-08-22: qty 180, 30d supply, fill #0

## 2021-08-22 ENCOUNTER — Other Ambulatory Visit (HOSPITAL_COMMUNITY): Payer: Self-pay

## 2021-09-24 ENCOUNTER — Other Ambulatory Visit (HOSPITAL_COMMUNITY): Payer: Self-pay

## 2021-09-24 DIAGNOSIS — I10 Essential (primary) hypertension: Secondary | ICD-10-CM | POA: Diagnosis not present

## 2021-09-24 DIAGNOSIS — E785 Hyperlipidemia, unspecified: Secondary | ICD-10-CM | POA: Diagnosis not present

## 2021-09-24 DIAGNOSIS — E291 Testicular hypofunction: Secondary | ICD-10-CM | POA: Diagnosis not present

## 2021-09-24 DIAGNOSIS — E1169 Type 2 diabetes mellitus with other specified complication: Secondary | ICD-10-CM | POA: Diagnosis not present

## 2021-09-24 DIAGNOSIS — I119 Hypertensive heart disease without heart failure: Secondary | ICD-10-CM | POA: Diagnosis not present

## 2021-09-25 ENCOUNTER — Other Ambulatory Visit (HOSPITAL_COMMUNITY): Payer: Self-pay

## 2021-10-01 ENCOUNTER — Other Ambulatory Visit: Payer: Self-pay | Admitting: Internal Medicine

## 2021-10-01 DIAGNOSIS — I10 Essential (primary) hypertension: Secondary | ICD-10-CM | POA: Diagnosis not present

## 2021-10-01 DIAGNOSIS — F331 Major depressive disorder, recurrent, moderate: Secondary | ICD-10-CM | POA: Diagnosis not present

## 2021-10-01 DIAGNOSIS — Z0000 Encounter for general adult medical examination without abnormal findings: Secondary | ICD-10-CM | POA: Diagnosis not present

## 2021-10-01 DIAGNOSIS — F419 Anxiety disorder, unspecified: Secondary | ICD-10-CM | POA: Diagnosis not present

## 2021-10-01 DIAGNOSIS — E559 Vitamin D deficiency, unspecified: Secondary | ICD-10-CM | POA: Diagnosis not present

## 2021-10-01 DIAGNOSIS — R911 Solitary pulmonary nodule: Secondary | ICD-10-CM

## 2021-10-01 DIAGNOSIS — I2510 Atherosclerotic heart disease of native coronary artery without angina pectoris: Secondary | ICD-10-CM | POA: Diagnosis not present

## 2021-10-04 ENCOUNTER — Other Ambulatory Visit (HOSPITAL_COMMUNITY): Payer: Self-pay

## 2021-10-04 MED ORDER — OXYCODONE HCL 10 MG PO TABS
ORAL_TABLET | ORAL | 0 refills | Status: DC
  Filled 2021-11-27: qty 180, 30d supply, fill #0

## 2021-10-04 MED ORDER — OXYCODONE HCL 10 MG PO TABS
ORAL_TABLET | ORAL | 0 refills | Status: DC
  Filled 2021-10-04: qty 180, 30d supply, fill #0

## 2021-10-22 ENCOUNTER — Other Ambulatory Visit (HOSPITAL_COMMUNITY): Payer: Self-pay

## 2021-11-01 ENCOUNTER — Other Ambulatory Visit: Payer: Self-pay | Admitting: Internal Medicine

## 2021-11-01 ENCOUNTER — Inpatient Hospital Stay: Admission: RE | Admit: 2021-11-01 | Payer: Medicare Other | Source: Ambulatory Visit

## 2021-11-01 DIAGNOSIS — R911 Solitary pulmonary nodule: Secondary | ICD-10-CM

## 2021-11-15 ENCOUNTER — Ambulatory Visit
Admission: RE | Admit: 2021-11-15 | Discharge: 2021-11-15 | Disposition: A | Discharge status: Home / Self Care | Payer: Medicare Other | Source: Ambulatory Visit | Attending: Internal Medicine | Admitting: Internal Medicine

## 2021-11-15 ENCOUNTER — Other Ambulatory Visit: Payer: Self-pay | Admitting: Internal Medicine

## 2021-11-15 DIAGNOSIS — E278 Other specified disorders of adrenal gland: Secondary | ICD-10-CM

## 2021-11-15 DIAGNOSIS — R911 Solitary pulmonary nodule: Secondary | ICD-10-CM

## 2021-11-15 DIAGNOSIS — R109 Unspecified abdominal pain: Secondary | ICD-10-CM | POA: Diagnosis not present

## 2021-11-15 DIAGNOSIS — E279 Disorder of adrenal gland, unspecified: Secondary | ICD-10-CM

## 2021-11-15 DIAGNOSIS — D3500 Benign neoplasm of unspecified adrenal gland: Secondary | ICD-10-CM | POA: Diagnosis not present

## 2021-11-15 DIAGNOSIS — J439 Emphysema, unspecified: Secondary | ICD-10-CM | POA: Diagnosis not present

## 2021-11-15 MED ORDER — IOPAMIDOL (ISOVUE-300) INJECTION 61%
100.0000 mL | Freq: Once | INTRAVENOUS | Status: AC | PRN
  Administered 2021-11-15: 100 mL via INTRAVENOUS

## 2021-11-27 ENCOUNTER — Other Ambulatory Visit (HOSPITAL_COMMUNITY): Payer: Self-pay

## 2021-12-03 ENCOUNTER — Other Ambulatory Visit (HOSPITAL_COMMUNITY): Payer: Self-pay

## 2021-12-03 MED ORDER — OXYCODONE HCL 10 MG PO TABS: ORAL_TABLET | ORAL | 0 refills | Status: DC

## 2021-12-03 MED ORDER — OXYCODONE HCL 10 MG PO TABS
ORAL_TABLET | ORAL | 0 refills | Status: DC
  Filled 2021-12-03 – 2021-12-31 (×2): qty 180, 30d supply, fill #0

## 2021-12-31 ENCOUNTER — Other Ambulatory Visit (HOSPITAL_COMMUNITY): Payer: Self-pay

## 2022-01-24 ENCOUNTER — Other Ambulatory Visit (HOSPITAL_COMMUNITY): Payer: Self-pay

## 2022-01-24 MED ORDER — OXYCODONE HCL 10 MG PO TABS
10.0000 mg | ORAL_TABLET | ORAL | 0 refills | Status: DC | PRN
  Filled 2022-03-11: qty 180, 30d supply, fill #0

## 2022-01-24 MED ORDER — OXYCODONE HCL 10 MG PO TABS
10.0000 mg | ORAL_TABLET | ORAL | 0 refills | Status: DC | PRN
  Filled 2022-02-12: qty 180, 30d supply, fill #0

## 2022-01-24 MED ORDER — BUTALBITAL-ACETAMINOPHEN 50-325 MG PO TABS
1.0000 | ORAL_TABLET | Freq: Two times a day (BID) | ORAL | 1 refills | Status: DC | PRN
  Filled 2022-01-24: qty 30, 15d supply, fill #0

## 2022-01-25 ENCOUNTER — Other Ambulatory Visit (HOSPITAL_COMMUNITY): Payer: Self-pay

## 2022-01-28 DIAGNOSIS — F419 Anxiety disorder, unspecified: Secondary | ICD-10-CM | POA: Diagnosis not present

## 2022-01-28 DIAGNOSIS — F331 Major depressive disorder, recurrent, moderate: Secondary | ICD-10-CM | POA: Diagnosis not present

## 2022-01-28 DIAGNOSIS — I10 Essential (primary) hypertension: Secondary | ICD-10-CM | POA: Diagnosis not present

## 2022-01-28 DIAGNOSIS — E1169 Type 2 diabetes mellitus with other specified complication: Secondary | ICD-10-CM | POA: Diagnosis not present

## 2022-02-12 ENCOUNTER — Other Ambulatory Visit (HOSPITAL_COMMUNITY): Payer: Self-pay

## 2022-03-11 ENCOUNTER — Other Ambulatory Visit (HOSPITAL_COMMUNITY): Payer: Self-pay

## 2022-03-12 ENCOUNTER — Other Ambulatory Visit (HOSPITAL_COMMUNITY): Payer: Self-pay

## 2022-04-10 DIAGNOSIS — L739 Follicular disorder, unspecified: Secondary | ICD-10-CM | POA: Diagnosis not present

## 2022-04-10 DIAGNOSIS — L820 Inflamed seborrheic keratosis: Secondary | ICD-10-CM | POA: Diagnosis not present

## 2022-04-10 DIAGNOSIS — D485 Neoplasm of uncertain behavior of skin: Secondary | ICD-10-CM | POA: Diagnosis not present

## 2022-04-11 ENCOUNTER — Other Ambulatory Visit (HOSPITAL_COMMUNITY): Payer: Self-pay

## 2022-04-11 MED ORDER — OXYCODONE HCL 10 MG PO TABS
10.0000 mg | ORAL_TABLET | ORAL | 0 refills | Status: DC | PRN
  Filled 2022-04-11: qty 180, 30d supply, fill #0

## 2022-04-11 MED ORDER — OXYCODONE HCL 10 MG PO TABS
10.0000 mg | ORAL_TABLET | ORAL | 0 refills | Status: DC | PRN
  Filled 2022-05-17 (×3): qty 180, 30d supply, fill #0

## 2022-04-12 ENCOUNTER — Other Ambulatory Visit (HOSPITAL_COMMUNITY): Payer: Self-pay

## 2022-04-15 ENCOUNTER — Other Ambulatory Visit (HOSPITAL_COMMUNITY): Payer: Self-pay

## 2022-05-17 ENCOUNTER — Other Ambulatory Visit (HOSPITAL_COMMUNITY): Payer: Self-pay

## 2022-05-17 IMAGING — CT CT CHEST-ABD-PELV W/ CM
2 of 5 series · 11 of 46 positions shown, 12 images · IV contrast (iopamidol)
Comparison: November 19, 2018.

CLINICAL DATA: Weight loss, palpitations, abdominal pain.

EXAM:
CT CHEST, ABDOMEN, AND PELVIS WITH CONTRAST
TECHNIQUE: Multidetector CT imaging of the chest, abdomen and pelvis was
performed following the standard protocol during bolus
administration of intravenous contrast.
CONTRAST:  100mL [id number] IOPAMIDOL (XIVV75-XNN) INJECTION 61%

[Series 2: cap with 5.00 br40 s3 axial · axial · 0.71mm/px · z∈[+1296,+1896]mm · 8 of 144 slices shown, 9 images]
[im 12/144  soft-tissue]
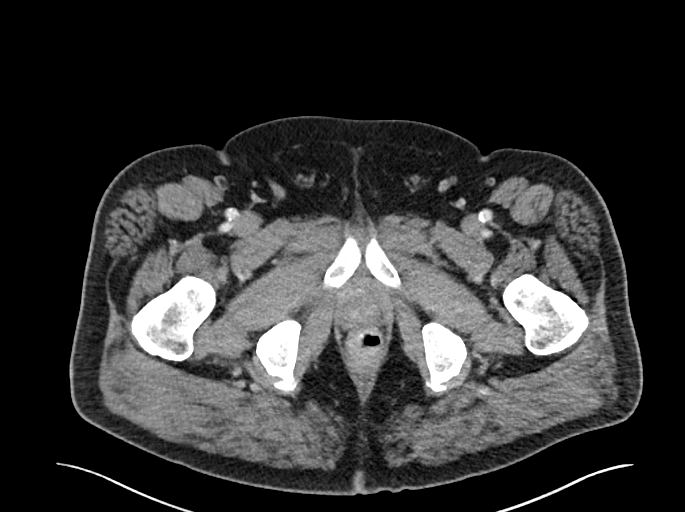
[im 12/144  bone]
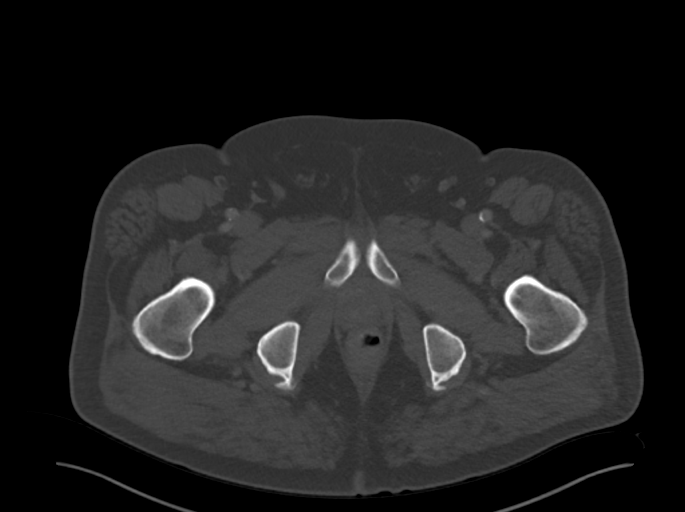
[im 36/144  soft-tissue]
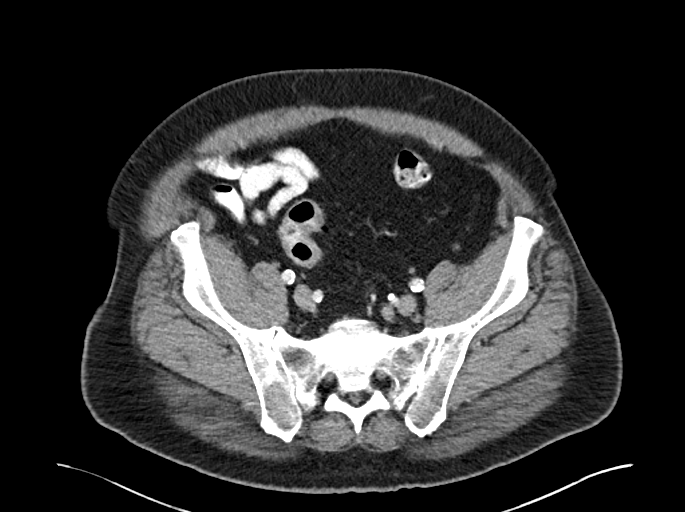
[im 48/144  soft-tissue]
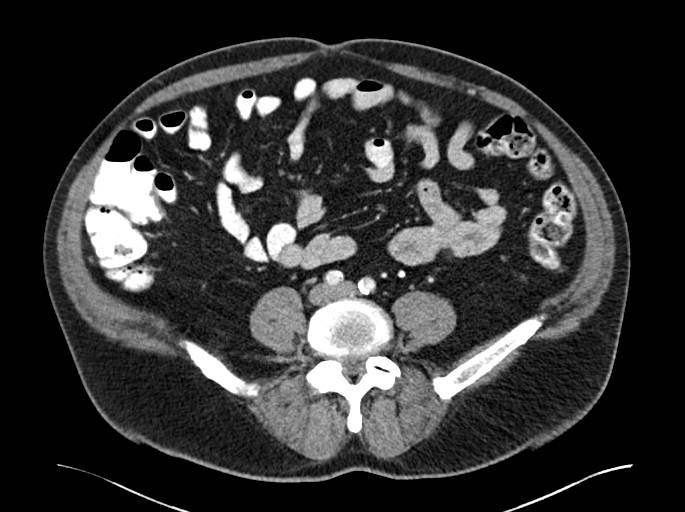
[im 60/144  soft-tissue]
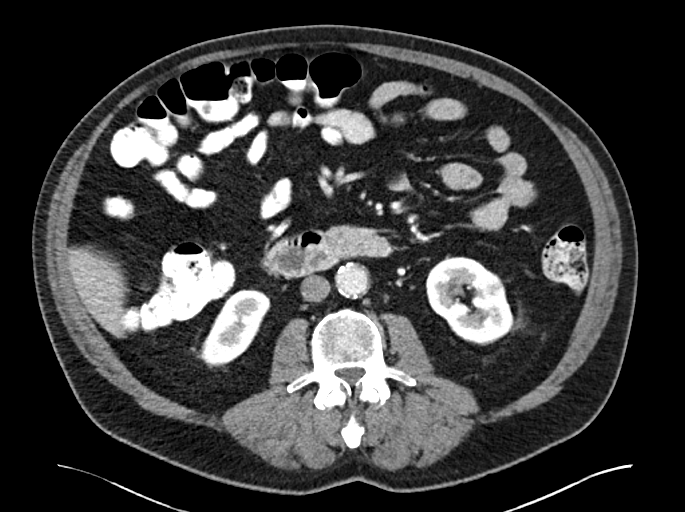
[im 84/144  soft-tissue]
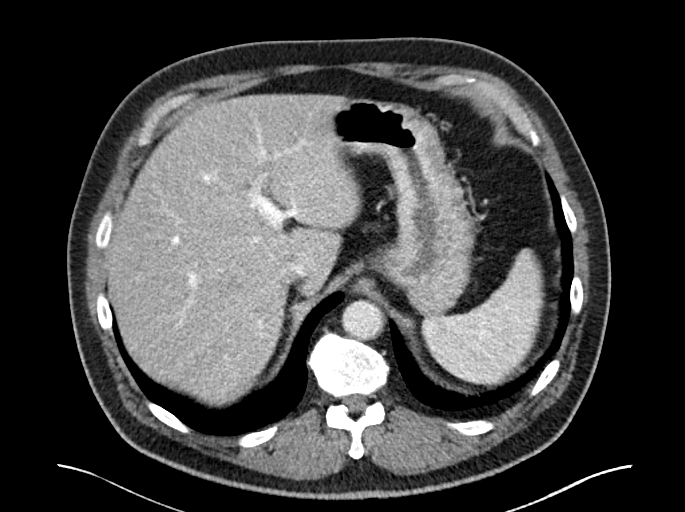
[im 96/144  soft-tissue]
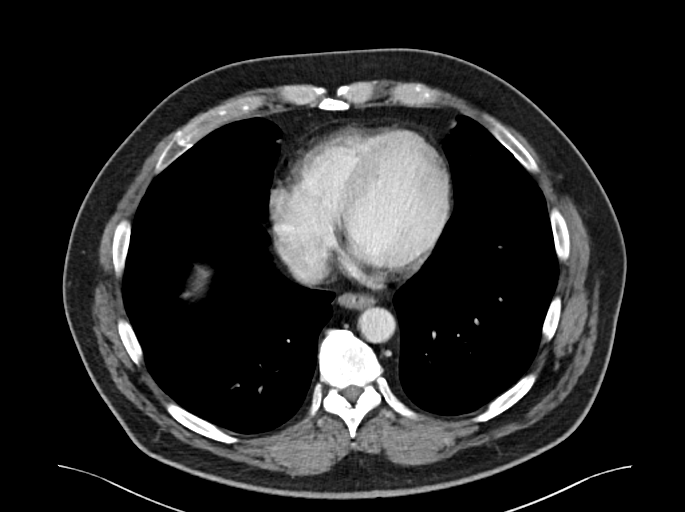
[im 108/144  soft-tissue]
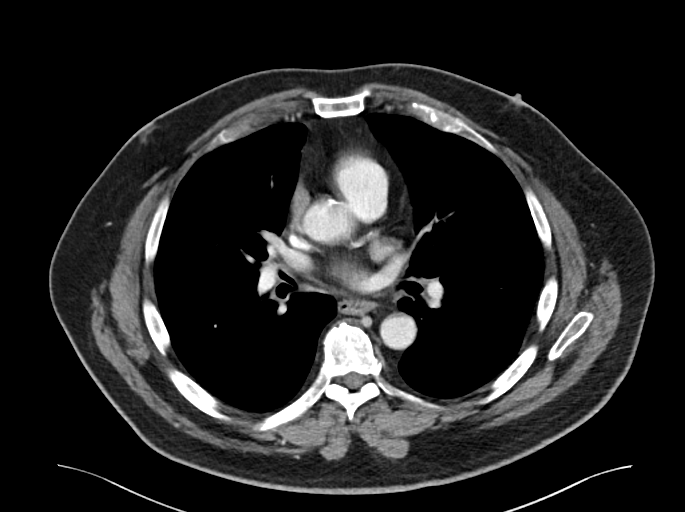
[im 132/144  soft-tissue]
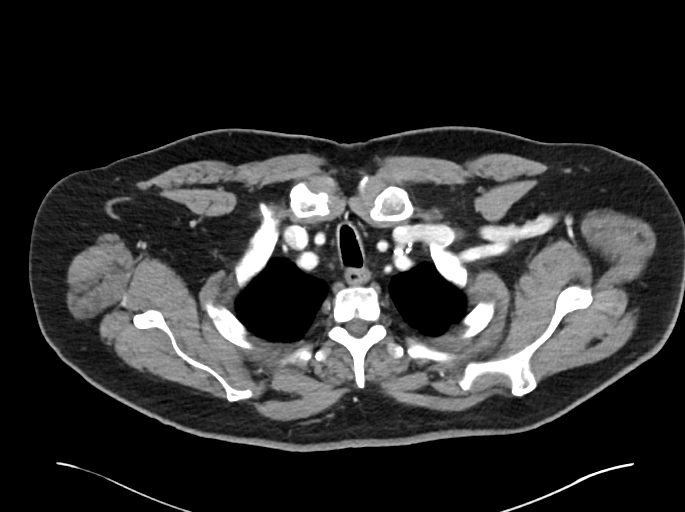

[Series 6: cap with 2.00 br40 s3 cor · coronal · 0.95mm/px · 3 of 174 slices shown]
[im 58/174  soft-tissue]
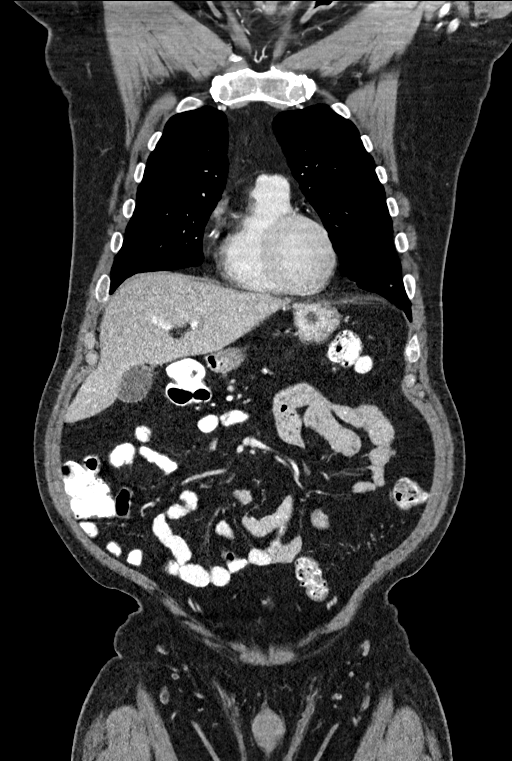
[im 77/174  soft-tissue]
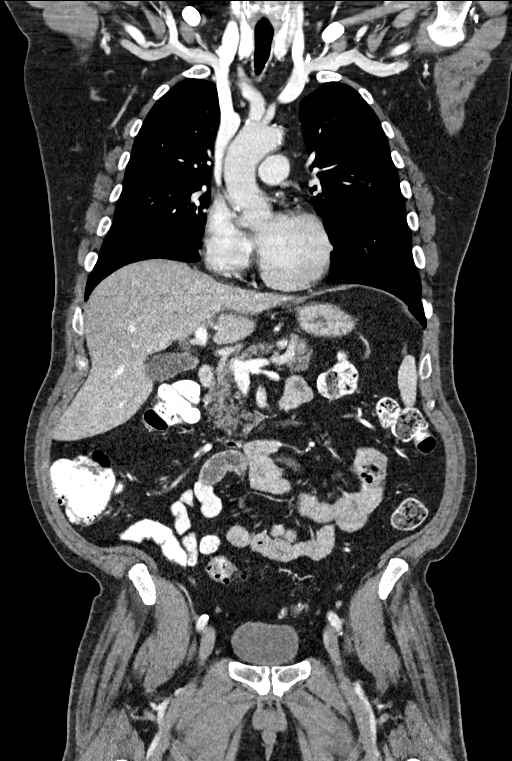
[im 97/174  soft-tissue]
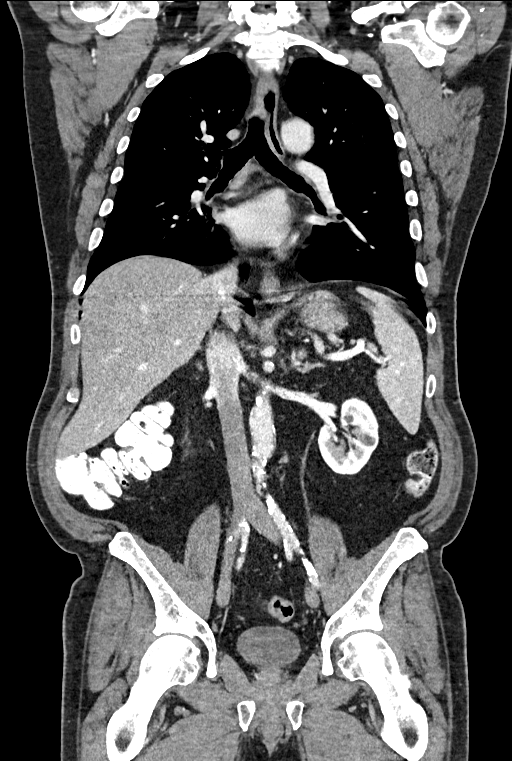

[11 of 46 positions shown; findings below may reference images not displayed]

FINDINGS: CT CHEST FINDINGS

Cardiovascular: Atherosclerosis of thoracic aorta is noted without
aneurysm or dissection. Normal cardiac size. No pericardial
effusion. Minimal coronary artery calcifications are noted.

Mediastinum/Nodes: No enlarged mediastinal, hilar, or axillary lymph
nodes. Thyroid gland, trachea, and esophagus demonstrate no
significant findings.

Lungs/Pleura: No pneumothorax or pleural effusion is noted. Mild
emphysematous disease is noted bilaterally. 4 mm nodule is noted
anteriorly in left lung base best seen on image number 136 of series
4. 2 mm nodule is noted in right middle lobe best seen on image
number 106 of series 4.

Musculoskeletal: No chest wall mass or suspicious bone lesions
identified.

CT ABDOMEN PELVIS FINDINGS

Hepatobiliary: Hepatic steatosis. Left hepatic cyst. No gallstones
or biliary dilatation is noted.

Pancreas: Unremarkable. No pancreatic ductal dilatation or
surrounding inflammatory changes.

Spleen: Normal in size without focal abnormality.

Adrenals/Urinary Tract: Right adrenal gland appears normal. 1.6 cm
left adrenal nodule is noted. No hydronephrosis or renal obstruction
is noted. No renal or ureteral calculi are noted. Urinary bladder is
unremarkable.

Stomach/Bowel: The stomach appears normal. There is no evidence of
bowel obstruction or inflammation.

Vascular/Lymphatic: Aortic atherosclerosis. No enlarged abdominal or
pelvic lymph nodes.

Reproductive: Prostate is unremarkable.

Other: No abdominal wall hernia or abnormality. No abdominopelvic
ascites.

Musculoskeletal: No acute or significant osseous findings.
IMPRESSION: Small pulmonary nodules are noted bilaterally, the largest measuring
4 mm in the left lung base. No follow-up needed if patient is
low-risk (and has no known or suspected primary neoplasm).
Non-contrast chest CT can be considered in 12 months if patient is
high-risk. This recommendation follows the consensus statement:
Guidelines for Management of Incidental Pulmonary Nodules Detected
[DATE].

Minimal coronary artery calcifications are noted.

Hepatic steatosis.

1.6 cm left adrenal nodule is noted. Follow-up CT scan or MRI in 12
months is recommended to ensure stability and rule out neoplasm.

Aortic Atherosclerosis (63NNF-USO.O) and Emphysema (63NNF-B4V.3).

## 2022-06-04 DIAGNOSIS — F331 Major depressive disorder, recurrent, moderate: Secondary | ICD-10-CM | POA: Diagnosis not present

## 2022-06-04 DIAGNOSIS — R911 Solitary pulmonary nodule: Secondary | ICD-10-CM | POA: Diagnosis not present

## 2022-06-04 DIAGNOSIS — I10 Essential (primary) hypertension: Secondary | ICD-10-CM | POA: Diagnosis not present

## 2022-06-04 DIAGNOSIS — F419 Anxiety disorder, unspecified: Secondary | ICD-10-CM | POA: Diagnosis not present

## 2022-06-04 DIAGNOSIS — E278 Other specified disorders of adrenal gland: Secondary | ICD-10-CM | POA: Diagnosis not present

## 2022-06-04 DIAGNOSIS — E1169 Type 2 diabetes mellitus with other specified complication: Secondary | ICD-10-CM | POA: Diagnosis not present

## 2022-06-06 ENCOUNTER — Other Ambulatory Visit (HOSPITAL_COMMUNITY): Payer: Self-pay

## 2022-06-06 MED ORDER — OXYCODONE HCL 10 MG PO TABS
10.0000 mg | ORAL_TABLET | ORAL | 0 refills | Status: DC | PRN
  Filled 2022-08-02: qty 180, 30d supply, fill #0

## 2022-06-06 MED ORDER — OXYCODONE HCL 10 MG PO TABS
10.0000 mg | ORAL_TABLET | ORAL | 0 refills | Status: DC | PRN
  Filled 2022-06-06 – 2022-06-25 (×2): qty 180, 30d supply, fill #0

## 2022-06-07 ENCOUNTER — Other Ambulatory Visit (HOSPITAL_COMMUNITY): Payer: Self-pay

## 2022-06-24 ENCOUNTER — Other Ambulatory Visit (HOSPITAL_COMMUNITY): Payer: Self-pay

## 2022-06-25 ENCOUNTER — Other Ambulatory Visit (HOSPITAL_COMMUNITY): Payer: Self-pay

## 2022-06-25 ENCOUNTER — Other Ambulatory Visit: Payer: Self-pay

## 2022-06-27 ENCOUNTER — Other Ambulatory Visit (HOSPITAL_COMMUNITY): Payer: Self-pay

## 2022-06-28 ENCOUNTER — Other Ambulatory Visit (HOSPITAL_COMMUNITY): Payer: Self-pay

## 2022-08-02 ENCOUNTER — Other Ambulatory Visit (HOSPITAL_COMMUNITY): Payer: Self-pay

## 2022-08-05 ENCOUNTER — Other Ambulatory Visit (HOSPITAL_COMMUNITY): Payer: Self-pay

## 2022-08-06 ENCOUNTER — Other Ambulatory Visit (HOSPITAL_COMMUNITY): Payer: Self-pay

## 2022-08-07 ENCOUNTER — Other Ambulatory Visit (HOSPITAL_COMMUNITY): Payer: Self-pay

## 2022-08-07 ENCOUNTER — Other Ambulatory Visit: Payer: Self-pay

## 2022-08-07 MED ORDER — TIZANIDINE HCL 2 MG PO TABS
2.0000 mg | ORAL_TABLET | Freq: Every day | ORAL | 0 refills | Status: DC | PRN
Start: 2022-08-05 — End: 2022-10-03
  Filled 2022-08-07: qty 30, 30d supply, fill #0

## 2022-08-07 MED ORDER — OXYCODONE HCL 10 MG PO TABS
10.0000 mg | ORAL_TABLET | ORAL | 0 refills | Status: DC | PRN
  Filled 2022-09-10: qty 180, 30d supply, fill #0

## 2022-08-07 MED ORDER — OXYCODONE HCL 10 MG PO TABS: 10.0000 mg | ORAL_TABLET | ORAL | 0 refills | Status: DC | PRN

## 2022-08-09 ENCOUNTER — Other Ambulatory Visit (HOSPITAL_COMMUNITY): Payer: Self-pay

## 2022-09-03 DIAGNOSIS — K1121 Acute sialoadenitis: Secondary | ICD-10-CM | POA: Diagnosis not present

## 2022-09-10 ENCOUNTER — Other Ambulatory Visit (HOSPITAL_COMMUNITY): Payer: Self-pay | Admitting: Registered Nurse

## 2022-09-10 ENCOUNTER — Other Ambulatory Visit (HOSPITAL_COMMUNITY): Payer: Self-pay

## 2022-09-10 DIAGNOSIS — R590 Localized enlarged lymph nodes: Secondary | ICD-10-CM

## 2022-09-10 DIAGNOSIS — K1121 Acute sialoadenitis: Secondary | ICD-10-CM | POA: Diagnosis not present

## 2022-09-11 ENCOUNTER — Ambulatory Visit (HOSPITAL_COMMUNITY)
Admission: RE | Admit: 2022-09-11 | Discharge: 2022-09-11 | Disposition: A | Discharge status: Home / Self Care | Payer: Medicare Other | Source: Ambulatory Visit | Attending: Registered Nurse | Admitting: Registered Nurse

## 2022-09-11 DIAGNOSIS — R590 Localized enlarged lymph nodes: Secondary | ICD-10-CM

## 2022-09-11 DIAGNOSIS — I10 Essential (primary) hypertension: Secondary | ICD-10-CM

## 2022-09-11 DIAGNOSIS — K1121 Acute sialoadenitis: Secondary | ICD-10-CM | POA: Insufficient documentation

## 2022-09-11 LAB — POCT I-STAT CREATININE: Creatinine, Ser: 0.8 mg/dL (ref 0.61–1.24)

## 2022-09-11 MED ORDER — IOHEXOL 350 MG/ML SOLN
75.0000 mL | Freq: Once | INTRAVENOUS | Status: AC | PRN
Start: 2022-09-11 — End: 2022-09-11
  Administered 2022-09-11: 75 mL via INTRAVENOUS

## 2022-09-16 ENCOUNTER — Other Ambulatory Visit (HOSPITAL_COMMUNITY): Payer: Self-pay | Admitting: Otolaryngology

## 2022-09-16 DIAGNOSIS — R221 Localized swelling, mass and lump, neck: Secondary | ICD-10-CM

## 2022-09-16 DIAGNOSIS — K118 Other diseases of salivary glands: Secondary | ICD-10-CM | POA: Diagnosis not present

## 2022-09-16 DIAGNOSIS — J3489 Other specified disorders of nose and nasal sinuses: Secondary | ICD-10-CM

## 2022-09-17 DIAGNOSIS — J3489 Other specified disorders of nose and nasal sinuses: Secondary | ICD-10-CM | POA: Diagnosis not present

## 2022-09-17 DIAGNOSIS — R221 Localized swelling, mass and lump, neck: Secondary | ICD-10-CM | POA: Diagnosis not present

## 2022-09-17 DIAGNOSIS — K118 Other diseases of salivary glands: Secondary | ICD-10-CM | POA: Diagnosis not present

## 2022-09-17 NOTE — Progress Notes (Signed)
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   HKM         

## 2022-09-27 ENCOUNTER — Encounter (HOSPITAL_COMMUNITY): Payer: Self-pay

## 2022-09-27 ENCOUNTER — Ambulatory Visit (HOSPITAL_COMMUNITY)
Admission: RE | Admit: 2022-09-27 | Discharge: 2022-09-27 | Disposition: A | Discharge status: Home / Self Care | Payer: Medicare Other | Source: Ambulatory Visit | Attending: Otolaryngology | Admitting: Otolaryngology

## 2022-09-27 DIAGNOSIS — J3489 Other specified disorders of nose and nasal sinuses: Secondary | ICD-10-CM | POA: Insufficient documentation

## 2022-09-27 DIAGNOSIS — M542 Cervicalgia: Secondary | ICD-10-CM | POA: Diagnosis not present

## 2022-09-27 MED ORDER — IOHEXOL 300 MG/ML  SOLN
75.0000 mL | Freq: Once | INTRAMUSCULAR | Status: AC | PRN
  Administered 2022-09-27: 75 mL via INTRAVENOUS

## 2022-10-01 DIAGNOSIS — E785 Hyperlipidemia, unspecified: Secondary | ICD-10-CM | POA: Diagnosis not present

## 2022-10-01 DIAGNOSIS — E559 Vitamin D deficiency, unspecified: Secondary | ICD-10-CM | POA: Diagnosis not present

## 2022-10-01 DIAGNOSIS — E291 Testicular hypofunction: Secondary | ICD-10-CM | POA: Diagnosis not present

## 2022-10-01 DIAGNOSIS — E1169 Type 2 diabetes mellitus with other specified complication: Secondary | ICD-10-CM | POA: Diagnosis not present

## 2022-10-01 DIAGNOSIS — F5221 Male erectile disorder: Secondary | ICD-10-CM | POA: Diagnosis not present

## 2022-10-03 ENCOUNTER — Other Ambulatory Visit (HOSPITAL_COMMUNITY): Payer: Self-pay

## 2022-10-03 ENCOUNTER — Encounter (HOSPITAL_COMMUNITY): Payer: Self-pay

## 2022-10-03 MED ORDER — BUTALBITAL-ACETAMINOPHEN 50-325 MG PO TABS
1.0000 | ORAL_TABLET | Freq: Two times a day (BID) | ORAL | 1 refills | Status: AC | PRN
Start: 2022-10-03 — End: ?
  Filled 2022-10-03 (×3): qty 30, 15d supply, fill #0

## 2022-10-03 MED ORDER — OXYCODONE HCL 10 MG PO TABS
10.0000 mg | ORAL_TABLET | ORAL | 0 refills | Status: AC | PRN
  Filled 2022-10-16: qty 180, 30d supply, fill #0

## 2022-10-03 MED ORDER — OXYCODONE HCL 10 MG PO TABS
10.0000 mg | ORAL_TABLET | ORAL | 0 refills | Status: AC | PRN
  Filled 2022-11-15: qty 180, 30d supply, fill #0

## 2022-10-03 MED ORDER — TIZANIDINE HCL 2 MG PO TABS
2.0000 mg | ORAL_TABLET | Freq: Every day | ORAL | 1 refills | Status: AC | PRN
  Filled 2022-10-03: qty 30, 30d supply, fill #0
  Filled 2022-11-15: qty 30, 30d supply, fill #1

## 2022-10-08 ENCOUNTER — Ambulatory Visit (HOSPITAL_COMMUNITY)
Admission: RE | Admit: 2022-10-08 | Discharge: 2022-10-08 | Disposition: A | Discharge status: Home / Self Care | Payer: Medicare Other | Source: Ambulatory Visit | Attending: Internal Medicine | Admitting: Internal Medicine

## 2022-10-08 ENCOUNTER — Other Ambulatory Visit (HOSPITAL_COMMUNITY): Payer: Self-pay | Admitting: Internal Medicine

## 2022-10-08 ENCOUNTER — Other Ambulatory Visit: Payer: Self-pay | Admitting: Radiology

## 2022-10-08 DIAGNOSIS — F331 Major depressive disorder, recurrent, moderate: Secondary | ICD-10-CM | POA: Diagnosis not present

## 2022-10-08 DIAGNOSIS — K118 Other diseases of salivary glands: Secondary | ICD-10-CM | POA: Diagnosis not present

## 2022-10-08 DIAGNOSIS — R296 Repeated falls: Secondary | ICD-10-CM

## 2022-10-08 DIAGNOSIS — I2510 Atherosclerotic heart disease of native coronary artery without angina pectoris: Secondary | ICD-10-CM | POA: Diagnosis not present

## 2022-10-08 DIAGNOSIS — R591 Generalized enlarged lymph nodes: Secondary | ICD-10-CM | POA: Diagnosis not present

## 2022-10-08 DIAGNOSIS — Z1331 Encounter for screening for depression: Secondary | ICD-10-CM | POA: Diagnosis not present

## 2022-10-08 DIAGNOSIS — E1169 Type 2 diabetes mellitus with other specified complication: Secondary | ICD-10-CM | POA: Diagnosis not present

## 2022-10-08 DIAGNOSIS — H9193 Unspecified hearing loss, bilateral: Secondary | ICD-10-CM | POA: Diagnosis not present

## 2022-10-08 DIAGNOSIS — R413 Other amnesia: Secondary | ICD-10-CM | POA: Insufficient documentation

## 2022-10-08 DIAGNOSIS — Z1339 Encounter for screening examination for other mental health and behavioral disorders: Secondary | ICD-10-CM | POA: Diagnosis not present

## 2022-10-08 DIAGNOSIS — I700 Atherosclerosis of aorta: Secondary | ICD-10-CM | POA: Diagnosis not present

## 2022-10-08 DIAGNOSIS — R42 Dizziness and giddiness: Secondary | ICD-10-CM | POA: Diagnosis not present

## 2022-10-08 DIAGNOSIS — I6381 Other cerebral infarction due to occlusion or stenosis of small artery: Secondary | ICD-10-CM | POA: Diagnosis not present

## 2022-10-08 DIAGNOSIS — Z0000 Encounter for general adult medical examination without abnormal findings: Secondary | ICD-10-CM | POA: Diagnosis not present

## 2022-10-08 DIAGNOSIS — J0110 Acute frontal sinusitis, unspecified: Secondary | ICD-10-CM | POA: Diagnosis not present

## 2022-10-08 MED ORDER — GADOBUTROL 1 MMOL/ML IV SOLN
10.0000 mL | Freq: Once | INTRAVENOUS | Status: AC | PRN
Start: 2022-10-08 — End: 2022-10-08
  Administered 2022-10-08: 10 mL via INTRAVENOUS

## 2022-10-08 NOTE — Progress Notes (Incomplete)
Chief Complaint: Patient was seen in consultation today for ultrasound-guided left parotid mass biopsy  Referring Physician(s): Skotnicki,Meghan A  Supervising Physician: Irish Lack  Patient Status: [facility] - Out-pt  History of Present Illness: Thomas Macdonald is a 67 y.o. male smoker with past medical history significant for anxiety/depression, coronary artery disease, chronic headaches, hyperlipidemia, hypertension, obesity, sleep apnea who presents now with a 58-month history of neck lymphadenopathy/lump on left side of face.  Subsequent imaging revealed : 1. 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 Recommend ENT consultation for management. See recent CT neck for additional findings in the neck. 2. Left ostiomeatal unit pattern sinus disease with evidence of inspissated secretions and/or fungal colonization. Masslike opacification of left middle meatus suggests possible underlying obstructing lesion, amenable to direct inspection. The aforementioned ENT consultation is also warranted for this finding. 3. Borderline enlarged upper cervical chain lymph nodes bilaterally, which are nonspecific  1. 5.3 x 2.7 cm ovoid enhancing mass along the posteroinferior aspect of the left parotid gland and extending to the left level 2 station. This may reflect a primary parotid neoplasm or nodal metastatic disease. ENT consultation is recommended and direct tissue sampling should be considered. 2. Adjacent borderline enlarged left level 2 lymph node measuring 10-11 mm in short axis, indeterminate. 3. 1.1 x 1.5 cm focus of asymmetric soft tissue density within the left paraglottic fat and extending cephalad toward the left aspect of the pre-epiglottic space. Although indeterminate, a mucosal/submucosal malignancy is a consideration. ENT consultation and direct visualization are recommended. Additionally, a PET-CT may be  helpful for further evaluation. 4. Incompletely imaged soft tissue density measuring at least 2.9 x 2.0 cm with components in the left nasal passage, superomedial left maxillary sinus and anterior left ethmoid air cells, incompletely characterized on the current examination. A dedicated maxillofacial CT (with contrast) is recommended for further evaluation. 5.  Aortic Atherosclerosis   He presents today for ultrasound-guided left parotid mass biopsy for further evaluation   Past Medical History:  Diagnosis Date   Anxiety    CAD (coronary artery disease) 12/23/2018   Chronic headaches    Chronic neck pain    Depression    H/O hypogonadism    Headache    HLD (hyperlipidemia)    HTN (hypertension) 12/23/2018   Hyperglycemia    Hypertension    Obesity    OSA (obstructive sleep apnea)    CPAP   Snoring    Tobacco abuse     Past Surgical History:  Procedure Laterality Date   CERVICAL SPINE SURGERY  2007   SHOULDER ARTHROSCOPY Right 2006    Allergies: Oxymorphone  Medications: Prior to Admission medications   Medication Sig Start Date End Date Taking? Authorizing Provider  ACETAMINOPHEN-BUTALBITAL 50-325 MG TABS Take 1 tablet by mouth 2 (two) times daily as needed. 08/29/20     ACETAMINOPHEN-BUTALBITAL 50-325 MG TABS Take 1 tablet by mouth 2 times a day as needed 10/26/20     ACETAMINOPHEN-BUTALBITAL 50-325 MG TABS Take 1 tablet by mouth 2 times a day as needed 12/27/20     ACETAMINOPHEN-BUTALBITAL 50-325 MG TABS Take 1 tablet by mouth twice a day as needed. 06/14/21     ACETAMINOPHEN-BUTALBITAL 50-325 MG TABS Take 1 tablet by mouth 2 (two) times daily as needed. 01/24/22     ACETAMINOPHEN-BUTALBITAL 50-325 MG TABS Take 1 tablet by mouth 2 (two) times daily as needed. 10/03/22     amLODIPine-Valsartan-HCTZ 1-610-96  MG TABS Take 1 tablet by mouth daily. 01/22/19   Toniann Fail, NP  aspirin EC 81 MG tablet Take 81 mg by mouth daily.    [provider]  buPROPion  (WELLBUTRIN XL) 300 MG 24 hr tablet Take 300 mg by mouth 2 (two) times daily.    [provider]  butalbital-acetaminophen-caffeine (FIORICET) 50-325-40 MG tablet Take by mouth daily as needed for headache.    [provider]  clonazePAM (KLONOPIN) 0.5 MG tablet Take 0.5 mg by mouth 2 (two) times daily as needed for anxiety.    [provider]  diclofenac Sodium (VOLTAREN) 1 % GEL Apply 4 grams to skin four times a day as directed 08/09/21     gabapentin (NEURONTIN) 400 MG capsule TAKE 1 CAPSULE BY MOUTH 4 TIMES DAILY 05/01/20 05/01/21  Verdon Cummins, MD  gabapentin (NEURONTIN) 400 MG capsule TAKE 1 CAPSULE BY MOUTH THREE TIMES DAILY 03/02/20 03/02/21  Verdon Cummins, MD  naloxone [facility]) nasal spray 4 mg/0.1 mL Use 1 spray into one nostril as a single dose as needed may repeat dose every 2-3 min until patient responsive or EMS arrives 02/21/21     Oxycodone HCl 10 MG TABS Take 10 mg by mouth every 4 (four) hours as needed for pain.     [provider]  Oxycodone HCl 10 MG TABS Take 1 tablet by mouth every four hours as needed for pain.  do not fill before 09/15/20 09/15/20     Oxycodone HCl 10 MG TABS Take 1 tablet by mouth every four hours as needed for pain 10/26/20     Oxycodone HCl 10 MG TABS Take 1 tablet by mouth every four hours as needed for pain (11/23/20) 11/23/20     Oxycodone HCl 10 MG TABS Take 1 tablet by mouth every 4 hours as needed for pain * Do not fill until 01/25/21 01/25/21     Oxycodone HCl 10 MG TABS Take 1 tablet by mouth every 4 hours as needed for pain 12/27/20     Oxycodone HCl 10 MG TABS Take 1 tablet by mouth every 4 hours as needed for pain (fill 04/05/21) 04/05/21     Oxycodone HCl 10 MG TABS Take 1 tablet by mouth every four hours as needed for pain (fill 03/05/21) 03/05/21     Oxycodone HCl 10 MG TABS Take 1 tablet by mouth every 4 hours as needed for pain (fill 05/09/21) 05/09/21     Oxycodone HCl 10 MG TABS Take 1 tablet by mouth every  4 hours as needed for pain (fill 06/09/21) 06/09/21     Oxycodone HCl 10 MG TABS Take 1 tablet by mouth every four hours as needed for pain. 07/12/21     Oxycodone HCl 10 MG TABS Take 1 tablet by mouth every four hours as needed for pain 08/15/21     Oxycodone HCl 10 MG TABS Take 1 tablet by mouth every 4 hours as needed for pain 10/04/21     Oxycodone HCl 10 MG TABS Take 1 tablet by mouth every four hours as needed for pain 12/03/21     Oxycodone HCl 10 MG TABS Take 1 tablet by mouth every four hours as needed for pain (9/22) 02/01/22     Oxycodone HCl 10 MG TABS Take 1 tablet (10 mg total) by mouth every 4 (four) hours as needed for pain. 02/21/22     Oxycodone HCl 10 MG TABS Take 1 tablet (10 mg total) by mouth every 4 (  four) hours as needed for pain 01/24/22     Oxycodone HCl 10 MG TABS Take 1 tablet (10 mg total) by mouth every 4 (four) hours as needed for pain 04/11/22     Oxycodone HCl 10 MG TABS Take 1 tablet (10 mg total) by mouth every 4 (four) hours as needed for pain. 06/06/22     Oxycodone HCl 10 MG TABS Take 1 tablet (10 mg total) by mouth every 4 (four) hours as needed for pain 09/03/22     Oxycodone HCl 10 MG TABS Take 1 tablet (10 mg total) by mouth every 4 (four) hours as needed for pain 08/05/22     Oxycodone HCl 10 MG TABS Take 1 tablet (10 mg total) by mouth every 4 (four) hours as needed for pain. 11/01/22     Oxycodone HCl 10 MG TABS Take 1 tablet (10 mg total) by mouth every 4 (four) hours as needed for pain. 10/03/22     rosuvastatin (CRESTOR) 10 MG tablet Take 1 tablet (10 mg total) by mouth daily. 12/23/18 03/23/19  Toniann Fail, NP  tiZANidine (ZANAFLEX) 2 MG tablet Take 1 tablet (2 mg total) by mouth daily as needed. 10/03/22        Family History  Adopted: Yes    Social History   Socioeconomic History   Marital status: Married    Spouse name: Not on file   Number of children: 4   Years of education: 14   Highest education level: Not on file  Occupational History    Occupation: Disabled   Tobacco Use   Smoking status: Every Day    Packs/day: 1    Types: Cigarettes   Smokeless tobacco: Never  Vaping Use   Vaping Use: Never used  Substance and Sexual Activity   Alcohol use: No    Alcohol/week: 0.0 standard drinks of alcohol   Drug use: No   Sexual activity: Not on file  Other Topics Concern   Not on file  Social History Narrative   5 cups of soda or coffee a day    Social Determinants of Health   Financial Resource Strain: Not on file  Food Insecurity: Not on file  Transportation Needs: Not on file  Physical Activity: Not on file  Stress: Not on file  Social Connections: Not on file     Review of Systems  Vital Signs:    Code Status:   Advance Care Plan: {Advance Care ZOXW:[id number]}    Physical Exam  Imaging: CT MAXILLOFACIAL W CONTRAST  Result Date: 10/03/2022 CLINICAL DATA:  Lump side of face palpable left APPROX. 1-2 months ago; bil temporal h/a; post neck pain EXAM: CT MAXILLOFACIAL WITH CONTRAST TECHNIQUE: Multidetector CT imaging of the maxillofacial structures was performed with intravenous contrast. Multiplanar CT image reconstructions were also generated. RADIATION DOSE REDUCTION: This exam was performed according to the departmental dose-optimization program which includes automated exposure control, adjustment of the mA and/or kV according to patient size and/or use of iterative reconstruction technique. CONTRAST:  75mL OMNIPAQUE IOHEXOL 300 MG/ML  SOLN COMPARISON:  CT neck Sep 11, 2022. FINDINGS: Osseous: No fracture or mandibular dislocation. No destructive process. Orbits: Negative. No traumatic or inflammatory finding. Sinuses: Completely opacified anterior left ethmoid air cells and left frontal sinus with areas of internal hyperdensity. Masslike opacification left middle meatus. Soft tissues: Large (3.0 x 2.4 x 5.2 cm) mass within the left parotid tail. Borderline enlarged upper cervical chain lymph nodes bilaterally.  Limited intracranial: No significant or  unexpected finding. IMPRESSION: 1. 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 Recommend ENT consultation for management. See recent CT neck for additional findings in the neck. 2. Left ostiomeatal unit pattern sinus disease with evidence of inspissated secretions and/or fungal colonization. Masslike opacification of left middle meatus suggests possible underlying obstructing lesion, amenable to direct inspection. The aforementioned ENT consultation is also warranted for this finding. 3. Borderline enlarged upper cervical chain lymph nodes bilaterally, which are nonspecific. Electronically Signed   By: Feliberto Harts M.D.   On: 10/03/2022 14:51   CT SOFT TISSUE NECK W CONTRAST  Result Date: 09/11/2022 CLINICAL DATA:  Provided history: Lymphadenopathy of neck. Additional history provided by the scanning technologist: The patient reports lymphadenopathy first noticed 3 weeks ago. EXAM: CT NECK WITH CONTRAST TECHNIQUE: Multidetector CT imaging of the neck was performed using the standard protocol following the bolus administration of intravenous contrast. RADIATION DOSE REDUCTION: This exam was performed according to the departmental dose-optimization program which includes automated exposure control, adjustment of the mA and/or kV according to patient size and/or use of iterative reconstruction technique. CONTRAST:  75mL OMNIPAQUE IOHEXOL 350 MG/ML SOLN COMPARISON:  None. FINDINGS: Pharynx and larynx: Punctate postinflammatory calcifications and/or tonsilloliths within the bilateral palatine tonsils. There is asymmetric soft tissue density within the left paraglottic fat extending cephalad toward the left aspect of the pre epiglottic space measuring 1.1 x 1.5 cm (for instance as seen on series 3, image 67) (series 5, image 77). No appreciable swelling or mass elsewhere within the oral cavity, pharynx or larynx.  Salivary glands: 5.3 x 2.7 cm ovoid enhancing mass along the posteroinferior aspect of the left parotid gland and at the level 2 station. In addition to homogeneous central enhancement. This lesion also has linear vascular enhancement at its periphery. Otherwise unremarkable appearance of the parotid and submandibular glands. Thyroid: Subcentimeter enhancing nodule within the left thyroid lobe not meeting consensus criteria for ultrasound follow-up based on size. Lymph nodes: 5.3 x 2.7 cm ovoid enhancing mass along the posteroinferior aspect of the left parotid gland and at the level 2 station, as described above. Immediately adjacent at the left level 2 station, there is a borderline enlarged lymph node measuring 10-11 mm in short axis (series 5, image 90). No pathologically enlarged lymph nodes identified elsewhere within the neck. Vascular: The major vascular structures of the neck are patent. Atherosclerotic plaque within the visualized aortic arch and proximal major branch vessels of the neck. Atherosclerotic plaque also present about the carotid bifurcations bilaterally, and within the intracranial internal carotid arteries at the imaged levels. Limited intracranial: No evidence of an acute intracranial abnormality within the field of view. Visualized orbits: Incompletely imaged. No orbital mass or acute orbital finding at the imaged levels. Mastoids and visualized paranasal sinuses: Incompletely imaged soft tissue density with components in the left nasal passage, superomedial left maxillary sinus and anterior left ethmoid air cells, measuring at least 2.9 cm in AP dimension and 2.0 cm in transverse dimension (for instance as seen on series 4, images 1-12). Mild mucosal thickening elsewhere within the left maxillary sinus. Trace mucosal thickening within the right maxillary sinus. No significant mastoid effusion. Skeleton: Prior C5-C6 ACDF. Cervical spondylosis. No acute fracture or aggressive osseous lesion.  Upper chest: No consolidation within the imaged lung apices. These results will be called to the ordering clinician or representative by the Radiologist Assistant, and communication documented in the PACS or [employer]. IMPRESSION:  1. 5.3 x 2.7 cm ovoid enhancing mass along the posteroinferior aspect of the left parotid gland and extending to the left level 2 station. This may reflect a primary parotid neoplasm or nodal metastatic disease. ENT consultation is recommended and direct tissue sampling should be considered. 2. Adjacent borderline enlarged left level 2 lymph node measuring 10-11 mm in short axis, indeterminate. 3. 1.1 x 1.5 cm focus of asymmetric soft tissue density within the left paraglottic fat and extending cephalad toward the left aspect of the pre-epiglottic space. Although indeterminate, a mucosal/submucosal malignancy is a consideration. ENT consultation and direct visualization are recommended. Additionally, a PET-CT may be helpful for further evaluation. 4. Incompletely imaged soft tissue density measuring at least 2.9 x 2.0 cm with components in the left nasal passage, superomedial left maxillary sinus and anterior left ethmoid air cells, incompletely characterized on the current examination. A dedicated maxillofacial CT (with contrast) is recommended for further evaluation. 5.  Aortic Atherosclerosis (ICD10-I70.0). Electronically Signed   By: Jackey Loge D.O.   On: 09/11/2022 11:53    Labs:  CBC: No results for input(s): "WBC", "HGB", "HCT", "PLT" in the last 8760 hours.  COAGS: No results for input(s): "INR", "APTT" in the last 8760 hours.  BMP: Recent Labs    09/11/22 1102  CREATININE 0.80    LIVER FUNCTION TESTS: No results for input(s): "BILITOT", "AST", "ALT", "ALKPHOS", "PROT", "ALBUMIN" in the last 8760 hours.  TUMOR MARKERS: No results for input(s): "AFPTM", "CEA", "CA199", "CHROMGRNA" in the last 8760 hours.  Assessment and Plan: 67 y.o. male smoker with  past medical history significant for anxiety/depression, coronary artery disease, chronic headaches, hyperlipidemia, hypertension, obesity, sleep apnea who presents now with a 50-month history of neck lymphadenopathy/lump on left side of face.  Subsequent imaging revealed : 1. 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 Recommend ENT consultation for management. See recent CT neck for additional findings in the neck. 2. Left ostiomeatal unit pattern sinus disease with evidence of inspissated secretions and/or fungal colonization. Masslike opacification of left middle meatus suggests possible underlying obstructing lesion, amenable to direct inspection. The aforementioned ENT consultation is also warranted for this finding. 3. Borderline enlarged upper cervical chain lymph nodes bilaterally, which are nonspecific  1. 5.3 x 2.7 cm ovoid enhancing mass along the posteroinferior aspect of the left parotid gland and extending to the left level 2 station. This may reflect a primary parotid neoplasm or nodal metastatic disease. ENT consultation is recommended and direct tissue sampling should be considered. 2. Adjacent borderline enlarged left level 2 lymph node measuring 10-11 mm in short axis, indeterminate. 3. 1.1 x 1.5 cm focus of asymmetric soft tissue density within the left paraglottic fat and extending cephalad toward the left aspect of the pre-epiglottic space. Although indeterminate, a mucosal/submucosal malignancy is a consideration. ENT consultation and direct visualization are recommended. Additionally, a PET-CT may be helpful for further evaluation. 4. Incompletely imaged soft tissue density measuring at least 2.9 x 2.0 cm with components in the left nasal passage, superomedial left maxillary sinus and anterior left ethmoid air cells, incompletely characterized on the current examination. A dedicated maxillofacial CT (with  contrast) is recommended for further evaluation. 5.  Aortic Atherosclerosis   He presents today for ultrasound-guided left parotid mass biopsy for further evaluation.Risks and benefits of procedure was discussed with the patient  including, but not limited to bleeding, infection, damage to adjacent structures or low yield requiring additional  tests.  All of the questions were answered and there is agreement to proceed.  Consent signed and in chart.    Thank you for this interesting consult.  I greatly enjoyed meeting Mickle Dovidio and look forward to participating in their care.  A copy of this report was sent to the requesting provider on this date.  Electronically Signed: D. Jeananne Rama, PA-C 10/08/2022, 3:20 PM   I spent a total of   20 minutes  in face to face in clinical consultation, greater than 50% of which was counseling/coordinating care for ultrasound guided left parotid mass biopsy

## 2022-10-09 ENCOUNTER — Ambulatory Visit (HOSPITAL_COMMUNITY)
Admission: RE | Admit: 2022-10-09 | Discharge: 2022-10-09 | Disposition: A | Discharge status: Home / Self Care | Payer: Medicare Other | Source: Ambulatory Visit | Attending: Otolaryngology | Admitting: Otolaryngology

## 2022-10-09 ENCOUNTER — Encounter (HOSPITAL_COMMUNITY): Payer: Self-pay

## 2022-10-09 ENCOUNTER — Other Ambulatory Visit: Payer: Self-pay

## 2022-10-09 DIAGNOSIS — F32A Depression, unspecified: Secondary | ICD-10-CM | POA: Insufficient documentation

## 2022-10-09 DIAGNOSIS — E785 Hyperlipidemia, unspecified: Secondary | ICD-10-CM | POA: Diagnosis not present

## 2022-10-09 DIAGNOSIS — J3489 Other specified disorders of nose and nasal sinuses: Secondary | ICD-10-CM | POA: Insufficient documentation

## 2022-10-09 DIAGNOSIS — D110 Benign neoplasm of parotid gland: Secondary | ICD-10-CM | POA: Diagnosis not present

## 2022-10-09 DIAGNOSIS — R220 Localized swelling, mass and lump, head: Secondary | ICD-10-CM | POA: Insufficient documentation

## 2022-10-09 DIAGNOSIS — I2510 Atherosclerotic heart disease of native coronary artery without angina pectoris: Secondary | ICD-10-CM | POA: Insufficient documentation

## 2022-10-09 DIAGNOSIS — I10 Essential (primary) hypertension: Secondary | ICD-10-CM | POA: Diagnosis not present

## 2022-10-09 DIAGNOSIS — F419 Anxiety disorder, unspecified: Secondary | ICD-10-CM | POA: Diagnosis not present

## 2022-10-09 DIAGNOSIS — K118 Other diseases of salivary glands: Secondary | ICD-10-CM

## 2022-10-09 DIAGNOSIS — I700 Atherosclerosis of aorta: Secondary | ICD-10-CM | POA: Diagnosis not present

## 2022-10-09 DIAGNOSIS — R221 Localized swelling, mass and lump, neck: Secondary | ICD-10-CM

## 2022-10-09 DIAGNOSIS — E669 Obesity, unspecified: Secondary | ICD-10-CM | POA: Insufficient documentation

## 2022-10-09 LAB — CBC WITH DIFFERENTIAL/PLATELET
Abs Immature Granulocytes: 0.04 10*3/uL (ref 0.00–0.07)
Basophils Absolute: 0.1 10*3/uL (ref 0.0–0.1)
Basophils Relative: 1 %
Eosinophils Absolute: 0.3 10*3/uL (ref 0.0–0.5)
Eosinophils Relative: 2 %
HCT: 49 % (ref 39.0–52.0)
Hemoglobin: 17.4 g/dL — ABNORMAL HIGH (ref 13.0–17.0)
Immature Granulocytes: 0 %
Lymphocytes Relative: 15 %
Lymphs Abs: 2.1 10*3/uL (ref 0.7–4.0)
MCH: 30.4 pg (ref 26.0–34.0)
MCHC: 35.5 g/dL (ref 30.0–36.0)
MCV: 85.5 fL (ref 80.0–100.0)
Monocytes Absolute: 0.8 10*3/uL (ref 0.1–1.0)
Monocytes Relative: 5 %
Neutro Abs: 11.1 10*3/uL — ABNORMAL HIGH (ref 1.7–7.7)
Neutrophils Relative %: 77 %
Platelets: 271 10*3/uL (ref 150–400)
RBC: 5.73 MIL/uL (ref 4.22–5.81)
RDW: 11.6 % (ref 11.5–15.5)
WBC: 14.3 10*3/uL — ABNORMAL HIGH (ref 4.0–10.5)
nRBC: 0 % (ref 0.0–0.2)

## 2022-10-09 LAB — PROTIME-INR
INR: 1 (ref 0.8–1.2)
Prothrombin Time: 13.3 seconds (ref 11.4–15.2)

## 2022-10-09 LAB — BASIC METABOLIC PANEL
Anion gap: 9 (ref 5–15)
BUN: 10 mg/dL (ref 8–23)
CO2: 27 mmol/L (ref 22–32)
Calcium: 9 mg/dL (ref 8.9–10.3)
Chloride: 100 mmol/L (ref 98–111)
Creatinine, Ser: 0.78 mg/dL (ref 0.61–1.24)
GFR, Estimated: >60 mL/min (ref >60)
Glucose, Bld: 206 mg/dL — ABNORMAL HIGH (ref 70–99)
Potassium: 3.3 mmol/L — ABNORMAL LOW (ref 3.5–5.1)
Sodium: 136 mmol/L (ref 135–145)

## 2022-10-09 MED ORDER — LIDOCAINE HCL (PF) 1 % IJ SOLN
INTRAMUSCULAR | Status: AC | PRN
  Administered 2022-10-09: 10 mL

## 2022-10-09 MED ORDER — SODIUM CHLORIDE 0.9 % IV SOLN: INTRAVENOUS | Status: DC

## 2022-10-09 MED ORDER — FENTANYL CITRATE (PF) 100 MCG/2ML IJ SOLN
INTRAMUSCULAR | Status: AC
  Filled 2022-10-09: qty 2

## 2022-10-09 MED ORDER — FENTANYL CITRATE (PF) 100 MCG/2ML IJ SOLN
INTRAMUSCULAR | Status: AC | PRN
  Administered 2022-10-09 (×3): 50 ug via INTRAVENOUS

## 2022-10-09 MED ORDER — MIDAZOLAM HCL 2 MG/2ML IJ SOLN
INTRAMUSCULAR | Status: AC
  Filled 2022-10-09: qty 2

## 2022-10-09 MED ORDER — MIDAZOLAM HCL 2 MG/2ML IJ SOLN
INTRAMUSCULAR | Status: AC | PRN
  Administered 2022-10-09 (×3): 1 mg via INTRAVENOUS

## 2022-10-09 MED ORDER — LIDOCAINE HCL 1 % IJ SOLN
INTRAMUSCULAR | Status: AC
  Filled 2022-10-09: qty 20

## 2022-10-09 NOTE — Sedation Documentation (Signed)
Sample #2 obtained 

## 2022-10-09 NOTE — Sedation Documentation (Signed)
Sample #3 obtained 

## 2022-10-09 NOTE — Sedation Documentation (Signed)
Sample #1 obtained 

## 2022-10-09 NOTE — Procedures (Signed)
Interventional Radiology Procedure Note  Procedure: Ultrasound guided left parotid mass biopsy   Findings: Please refer to procedural dictation for full description. 18 ga core x3.  Complications: None immediate  Estimated Blood Loss: < 5 mL  Recommendations: 1 hour bedrest. Follow up Pathology results.   Marliss Coots, MD

## 2022-10-09 NOTE — Discharge Instructions (Signed)
Please call Interventional Radiology clinic [phone] with any questions or concerns.   You may remove your dressing and shower tomorrow.    Needle Biopsy, Care After These instructions tell you how to care for yourself after your procedure. Your doctor may also give you more specific instructions. Call your doctor if youhave any problems or questions. What can I expect after the procedure? After the procedure, it is common to have: Soreness. Bruising. Mild pain. Follow these instructions at home:  Return to your normal activities as told by your doctor. Ask your doctor what activities are safe for you. Take over-the-counter and prescription medicines only as told by your doctor. Wash your hands with soap and water before you change your bandage (dressing). If you cannot use soap and water, use hand sanitizer. Follow instructions from your doctor about: How to take care of your puncture site. When and how to change your bandage. When to remove your bandage. Check your puncture site every day for signs of infection. Watch for: Redness, swelling, or pain. Fluid or blood. Pus or a bad smell. Warmth. Do not take baths, swim, or use a hot tub until your doctor approves. Ask your doctor if you may take showers. You may only be allowed to take sponge baths. Keep all follow-up visits as told by your doctor. This is important. Contact a doctor if you have: A fever. Redness, swelling, or pain at the puncture site, and it lasts longer than a few days. Fluid, blood, or pus coming from the puncture site. Warmth coming from the puncture site. Get help right away if: You have a lot of bleeding from the puncture site. Summary After the procedure, it is common to have soreness, bruising, or mild pain at the puncture site. Check your puncture site every day for signs of infection, such as redness, swelling, or pain. Get help right away if you have severe bleeding from your puncture  site. This information is not intended to replace advice given to you by your health care provider. Make sure you discuss any questions you have with your healthcare provider. Document Revised: 10/28/2019 Document Reviewed: 10/28/2019 Elsevier Patient Education  2022 [employer] Inc[employer]   Moderate Conscious Sedation, Adult, Care After This sheet gives you information about how to care for yourself after your procedure. Your health care provider may also give you more specific instructions. If you have problems or questions, contact your health care provider. What can I expect after the procedure? After the procedure, it is common to have: Sleepiness for several hours. Impaired judgment for several hours. Difficulty with balance. Vomiting if you eat too soon. Follow these instructions at home: For the time period you were told by your health care provider: Rest. Do not participate in activities where you could fall or become injured. Do not drive or use machinery. Do not drink alcohol. Do not take sleeping pills or medicines that cause drowsiness. Do not make important decisions or sign legal documents. Do not take care of children on your own.      Eating and drinking Follow the diet recommended by your health care provider. Drink enough fluid to keep your urine pale yellow. If you vomit: Drink water, juice, or soup when you can drink without vomiting. Make sure you have little or no nausea before eating solid foods.   General instructions Take over-the-counter and prescription medicines only as told by your health care provider. Have a responsible adult stay with you for the time you are told.   It is important to have someone help care for you until you are awake and alert. Do not smoke. Keep all follow-up visits as told by your health care provider. This is important. Contact a health care provider if: You are still sleepy or having trouble with balance after 24 hours. You feel  light-headed. You keep feeling nauseous or you keep vomiting. You develop a rash. You have a fever. You have redness or swelling around the IV site. Get help right away if: You have trouble breathing. You have new-onset confusion at home. Summary After the procedure, it is common to feel sleepy, have impaired judgment, or feel nauseous if you eat too soon. Rest after you get home. Know the things you should not do after the procedure. Follow the diet recommended by your health care provider and drink enough fluid to keep your urine pale yellow. Get help right away if you have trouble breathing or new-onset confusion at home. This information is not intended to replace advice given to you by your health care provider. Make sure you discuss any questions you have with your health care provider. Document Revised: 08/27/2019 Document Reviewed: 03/25/2019 Elsevier Patient Education  2021 Else[employer] Inc.     

## 2022-10-10 LAB — SURGICAL PATHOLOGY

## 2022-10-16 ENCOUNTER — Other Ambulatory Visit (HOSPITAL_COMMUNITY): Payer: Self-pay

## 2022-10-16 ENCOUNTER — Other Ambulatory Visit: Payer: Self-pay

## 2022-10-18 DIAGNOSIS — J324 Chronic pansinusitis: Secondary | ICD-10-CM | POA: Diagnosis not present

## 2022-10-18 DIAGNOSIS — D119 Benign neoplasm of major salivary gland, unspecified: Secondary | ICD-10-CM | POA: Diagnosis not present

## 2022-10-18 DIAGNOSIS — J3489 Other specified disorders of nose and nasal sinuses: Secondary | ICD-10-CM | POA: Diagnosis not present

## 2022-10-24 DIAGNOSIS — M6281 Muscle weakness (generalized): Secondary | ICD-10-CM | POA: Diagnosis not present

## 2022-10-24 DIAGNOSIS — R2689 Other abnormalities of gait and mobility: Secondary | ICD-10-CM | POA: Diagnosis not present

## 2022-10-25 ENCOUNTER — Other Ambulatory Visit: Payer: Self-pay | Admitting: Otolaryngology

## 2022-10-29 DIAGNOSIS — R2689 Other abnormalities of gait and mobility: Secondary | ICD-10-CM | POA: Diagnosis not present

## 2022-10-29 DIAGNOSIS — M6281 Muscle weakness (generalized): Secondary | ICD-10-CM | POA: Diagnosis not present

## 2022-10-31 DIAGNOSIS — R2689 Other abnormalities of gait and mobility: Secondary | ICD-10-CM | POA: Diagnosis not present

## 2022-10-31 DIAGNOSIS — M6281 Muscle weakness (generalized): Secondary | ICD-10-CM | POA: Diagnosis not present

## 2022-10-31 NOTE — Pre-Procedure Instructions (Signed)
Surgical Instructions    Your procedure is scheduled on Tuesday, June 25th.  Report to [facility] Main Entrance "A" at 12:00 P.M., then check in with the Admitting office.  Call this number if you have problems the morning of surgery:  [phone]  If you have any questions prior to your surgery date call [phone]: Open Monday-Friday 8am-4pm If you experience any cold or flu symptoms such as cough, fever, chills, shortness of breath, etc. between now and your scheduled surgery, please notify us at the above number.     Remember:  Do not eat after midnight the night before your surgery  You may drink clear liquids until 11:00 AM the morning of your surgery.   Clear liquids allowed are: Water, Non-Citrus Juices (without pulp), Carbonated Beverages, Clear Tea, Black Coffee Only (NO MILK, CREAM OR POWDERED CREAMER of any kind), and Gatorade.    Take these medicines the morning of surgery with A SIP OF WATER  amLODipine (NORVASC)  DULoxetine (CYMBALTA)  rosuvastatin (CRESTOR)   If needed: clonazePAM (KLONOPIN)  Oxycodone HCl  tiZANidine (ZANAFLEX)    Follow your surgeon's instructions on when to stop Aspirin.  If no instructions were given by your surgeon then you will need to call the office to get those instructions.     As of today, STOP taking any Aleve, Naproxen, Ibuprofen, Motrin, Advil, Goody's, BC's, all herbal medications, fish oil, and all vitamins.  WHAT DO I DO ABOUT MY DIABETES MEDICATION?   Do not take metFORMIN (GLUCOPHAGE) the morning of surgery.    HOW TO MANAGE YOUR DIABETES BEFORE AND AFTER SURGERY  Why is it important to control my blood sugar before and after surgery? Improving blood sugar levels before and after surgery helps healing and can limit problems. A way of improving blood sugar control is eating a healthy diet by:  Eating less sugar and carbohydrates  Increasing activity/exercise  Talking with your doctor about reaching your blood sugar  goals High blood sugars (greater than 180 mg/dL) can raise your risk of infections and slow your recovery, so you will need to focus on controlling your diabetes during the weeks before surgery. Make sure that the doctor who takes care of your diabetes knows about your planned surgery including the date and location.  How do I manage my blood sugar before surgery? Check your blood sugar at least 4 times a day, starting 2 days before surgery, to make sure that the level is not too high or low.  Check your blood sugar the morning of your surgery when you wake up and every 2 hours until you get to the Short Stay unit.  If your blood sugar is less than 70 mg/dL, you will need to treat for low blood sugar: Do not take insulin. Treat a low blood sugar (less than 70 mg/dL) with  cup of clear juice (cranberry or apple), 4 glucose tablets, OR glucose gel. Recheck blood sugar in 15 minutes after treatment (to make sure it is greater than 70 mg/dL). If your blood sugar is not greater than 70 mg/dL on recheck, call [phone] for further instructions. Report your blood sugar to the short stay nurse when you get to Short Stay.  If you are admitted to the hospital after surgery: Your blood sugar will be checked by the staff and you will probably be given insulin after surgery (instead of oral diabetes medicines) to make sure you have good blood sugar levels. The goal for blood sugar control after surgery  is 80-180 mg/dL.                     Do NOT Smoke (Tobacco/Vaping) for 24 hours prior to your procedure.  If you use a CPAP at night, you may bring your mask/headgear for your overnight stay.   Contacts, glasses, piercing's, hearing aid's, dentures or partials may not be worn into surgery, please bring cases for these belongings.    For patients admitted to the hospital, discharge time will be determined by your treatment team.   Patients discharged the day of surgery will not be allowed to drive  home, and someone needs to stay with them for 24 hours.  SURGICAL WAITING ROOM VISITATION Patients having surgery or a procedure may have no more than 2 support people in the waiting area - these visitors may rotate.   Children under the age of 62 must have an adult with them who is not the patient. If the patient needs to stay at the hospital during part of their recovery, the visitor guidelines for inpatient rooms apply. Pre-op nurse will coordinate an appropriate time for 1 support person to accompany patient in pre-op.  This support person may not rotate.   Please refer to the [facility] website for the visitor guidelines for Inpatients (after your surgery is over and you are in a regular room).    Special instructions:   [facility]- Preparing For Surgery  Before surgery, you can play an important role. Because skin is not sterile, your skin needs to be as free of germs as possible. You can reduce the number of germs on your skin by washing with CHG (chlorahexidine gluconate) Soap before surgery.  CHG is an antiseptic cleaner which kills germs and bonds with the skin to continue killing germs even after washing.    Oral Hygiene is also important to reduce your risk of infection.  Remember - BRUSH YOUR TEETH THE MORNING OF SURGERY WITH YOUR REGULAR TOOTHPASTE  Please do not use if you have an allergy to CHG or antibacterial soaps. If your skin becomes reddened/irritated stop using the CHG.  Do not shave (including legs and underarms) for at least 48 hours prior to first CHG shower. It is OK to shave your face.  Please follow these instructions carefully.   Shower the NIGHT BEFORE SURGERY and the MORNING OF SURGERY  If you chose to wash your hair, wash your hair first as usual with your normal shampoo.  After you shampoo, rinse your hair and body thoroughly to remove the shampoo.  Use CHG Soap as you would any other liquid soap. You can apply CHG directly to the skin and wash gently  with a scrungie or a clean washcloth.   Apply the CHG Soap to your body ONLY FROM THE NECK DOWN.  Do not use on open wounds or open sores. Avoid contact with your eyes, ears, mouth and genitals (private parts). Wash Face and genitals (private parts)  with your normal soap.   Wash thoroughly, paying special attention to the area where your surgery will be performed.  Thoroughly rinse your body with warm water from the neck down.  DO NOT shower/wash with your normal soap after using and rinsing off the CHG Soap.  Pat yourself dry with a CLEAN TOWEL.  Wear CLEAN PAJAMAS to bed the night before surgery  Place CLEAN SHEETS on your bed the night before your surgery  DO NOT SLEEP WITH PETS.   Day of Surgery: Take  a shower with CHG soap. Do not wear jewelry or makeup Do not wear lotions, powders, perfumes/colognes, or deodorant. Do not shave 48 hours prior to surgery.  Men may shave face and neck. Do not bring valuables to the hospital.  [facility] is not responsible for any belongings or valuables. Do not wear nail polish, gel polish, artificial nails, or any other type of covering on natural nails (fingers and toes) If you have artificial nails or gel coating that need to be removed by a nail salon, please have this removed prior to surgery. Artificial nails or gel coating may interfere with anesthesia's ability to adequately monitor your vital signs. Wear Clean/Comfortable clothing the morning of surgery Remember to brush your teeth WITH YOUR REGULAR TOOTHPASTE.   Please read over the following fact sheets that you were given.    If you received a COVID test during your pre-op visit  it is requested that you wear a mask when out in public, stay away from anyone that may not be feeling well and notify your surgeon if you develop symptoms. If you have been in contact with anyone that has tested positive in the last 10 days please notify you surgeon.

## 2022-11-01 ENCOUNTER — Encounter (HOSPITAL_COMMUNITY): Payer: Self-pay

## 2022-11-01 ENCOUNTER — Encounter (HOSPITAL_COMMUNITY)
Admission: RE | Admit: 2022-11-01 | Discharge: 2022-11-01 | Disposition: A | Discharge status: Home / Self Care | Payer: Medicare Other | Source: Ambulatory Visit | Attending: Otolaryngology | Admitting: Otolaryngology

## 2022-11-01 ENCOUNTER — Other Ambulatory Visit: Payer: Self-pay

## 2022-11-01 VITALS — BP 143/71 | HR 92 | Temp 98.2°F | Resp 18 | Ht 72.0 in | Wt 241.1 lb

## 2022-11-01 DIAGNOSIS — E119 Type 2 diabetes mellitus without complications: Secondary | ICD-10-CM | POA: Insufficient documentation

## 2022-11-01 DIAGNOSIS — G4733 Obstructive sleep apnea (adult) (pediatric): Secondary | ICD-10-CM | POA: Diagnosis not present

## 2022-11-01 DIAGNOSIS — Z7984 Long term (current) use of oral hypoglycemic drugs: Secondary | ICD-10-CM | POA: Insufficient documentation

## 2022-11-01 DIAGNOSIS — J324 Chronic pansinusitis: Secondary | ICD-10-CM | POA: Diagnosis not present

## 2022-11-01 DIAGNOSIS — I2510 Atherosclerotic heart disease of native coronary artery without angina pectoris: Secondary | ICD-10-CM | POA: Insufficient documentation

## 2022-11-01 DIAGNOSIS — G8929 Other chronic pain: Secondary | ICD-10-CM | POA: Diagnosis not present

## 2022-11-01 DIAGNOSIS — Z01818 Encounter for other preprocedural examination: Secondary | ICD-10-CM | POA: Insufficient documentation

## 2022-11-01 DIAGNOSIS — M542 Cervicalgia: Secondary | ICD-10-CM | POA: Insufficient documentation

## 2022-11-01 DIAGNOSIS — R9431 Abnormal electrocardiogram [ECG] [EKG]: Secondary | ICD-10-CM | POA: Insufficient documentation

## 2022-11-01 DIAGNOSIS — I10 Essential (primary) hypertension: Secondary | ICD-10-CM | POA: Diagnosis not present

## 2022-11-01 HISTORY — DX: Type 2 diabetes mellitus without complications: E11.9

## 2022-11-01 LAB — BASIC METABOLIC PANEL
Anion gap: 9 (ref 5–15)
BUN: 10 mg/dL (ref 8–23)
CO2: 28 mmol/L (ref 22–32)
Calcium: 8.5 mg/dL — ABNORMAL LOW (ref 8.9–10.3)
Chloride: 96 mmol/L — ABNORMAL LOW (ref 98–111)
Creatinine, Ser: 0.79 mg/dL (ref 0.61–1.24)
GFR, Estimated: >60 mL/min (ref >60)
Glucose, Bld: 275 mg/dL — ABNORMAL HIGH (ref 70–99)
Potassium: 3.4 mmol/L — ABNORMAL LOW (ref 3.5–5.1)
Sodium: 133 mmol/L — ABNORMAL LOW (ref 135–145)

## 2022-11-01 LAB — HEMOGLOBIN A1C
Hgb A1c MFr Bld: 7.1 % — ABNORMAL HIGH (ref 4.8–5.6)
Mean Plasma Glucose: 157.07 mg/dL

## 2022-11-01 LAB — CBC
HCT: 46.5 % (ref 39.0–52.0)
Hemoglobin: 16 g/dL (ref 13.0–17.0)
MCH: 30 pg (ref 26.0–34.0)
MCHC: 34.4 g/dL (ref 30.0–36.0)
MCV: 87.2 fL (ref 80.0–100.0)
Platelets: 274 10*3/uL (ref 150–400)
RBC: 5.33 MIL/uL (ref 4.22–5.81)
RDW: 11.6 % (ref 11.5–15.5)
WBC: 12.2 10*3/uL — ABNORMAL HIGH (ref 4.0–10.5)
nRBC: 0 % (ref 0.0–0.2)

## 2022-11-01 LAB — GLUCOSE, CAPILLARY: Glucose-Capillary: 277 mg/dL — ABNORMAL HIGH (ref 70–99)

## 2022-11-01 NOTE — Progress Notes (Addendum)
PCP - Dr. Alysia Penna Cardiologist - denies  PPM/ICD - denies   Chest x-ray - 03/10/12 EKG - 11/01/22 Stress Test - 01/11/19 ECHO - 01/14/19 Cardiac Cath - denies  Sleep Study - 06/05/15, OSA+ CPAP - denies. Pt states his mask was always leaking out air and he couldn't tolerate the CPAP  DM- Pt does not check CBG at home and does not know what his typical fasting levels are. Pt's CBG 277 today. He states he had peanut butter and bread around 0800 (and this is what he typically has every day for "breakfast.") No prior A1C found. Pt is unsure if he has had an A1C drawn before.  Last dose of GLP1 agonist-  n/a   ASA/Blood Thinner Instructions: n/a   ERAS Protcol - yes, no drink   COVID TEST- n/a   Anesthesia review: Yes, CBG 277 at PAT  Patient denies shortness of breath, fever, cough and chest pain at PAT appointment   All instructions explained to the patient, with a verbal understanding of the material. Patient agrees to go over the instructions while at home for a better understanding.  The opportunity to ask questions was provided.

## 2022-11-04 NOTE — Anesthesia Preprocedure Evaluation (Addendum)
Anesthesia Evaluation  Patient identified by MRN, date of birth, ID band Patient awake    Reviewed: Allergy & Precautions, NPO status , Patient's Chart, lab work & pertinent test results  History of Anesthesia Complications Negative for: history of anesthetic complications  Airway Mallampati: IV  TM Distance: <3 FB Neck ROM: Limited    Dental  (+) Dental Advisory Given   Pulmonary sleep apnea , Current Smoker and Patient abstained from smoking.    + wheezing      Cardiovascular hypertension, Pt. on medications (-) angina + CAD  (-) Past MI and (-) CHF  Rhythm:Regular     Neuro/Psych  Headaches PSYCHIATRIC DISORDERS Anxiety Depression     Neuromuscular disease    GI/Hepatic negative GI ROS, Neg liver ROS,,,  Endo/Other  diabetes, Type 2    Renal/GU negative Renal ROS     Musculoskeletal negative musculoskeletal ROS (+)    Abdominal   Peds  Hematology negative hematology ROS (+) Lab Results      Component                Value               Date                      WBC                      12.2 (H)            11/01/2022                HGB                      16.0                11/01/2022                HCT                      46.5                11/01/2022                MCV                      87.2                11/01/2022                PLT                      274                 11/01/2022              Anesthesia Other Findings   Reproductive/Obstetrics                             Anesthesia Physical Anesthesia Plan  ASA: 3  Anesthesia Plan: General   Post-op Pain Management: Ofirmev IV (intra-op)*   Induction: Intravenous  PONV Risk Score and Plan: 1 and Ondansetron and Dexamethasone  Airway Management Planned: Oral ETT and Video Laryngoscope Planned  Additional Equipment: None  Intra-op Plan:   Post-operative Plan: Extubation in OR  Informed Consent: I have  reviewed the patients History and Physical, chart, labs and  discussed the procedure including the risks, benefits and alternatives for the proposed anesthesia with the patient or authorized representative who has indicated his/her understanding and acceptance.     Dental advisory given  Plan Discussed with: CRNA  Anesthesia Plan Comments: (See PAT note from 6/21 by Sherlie Ban PA-C  Flexible Laryngoscopy Procedure Note Indications: Neck mass  Risks, benefits and clinical relevance of the study were discussed. The  patient understands and agrees to proceed.   After adequate topical anesthetic was applied, 4 mm flexible laryngoscope  was passed through the nasal cavity without difficulty.  Flexible laryngoscopy shows patent anterior nasal cavity with minimal  crusting, no discharge or infection.  Moderate arytenoid edema and erythema is noted, otherwise normal base of  tongue and supraglottis  Normal vocal cord mobility without vocal cord nodule, mass, polyp or  tumor. Hypopharynx normal without mass, pooling of secretions or  aspiration.   )        Anesthesia Quick Evaluation

## 2022-11-04 NOTE — Progress Notes (Signed)
Case: [id number] Date/Time: 11/05/22 1345   Procedures:      SINUS ENDO WITH FUSION (Bilateral)     TURBINATE REDUCTION/SUBMUCOSAL RESECTION (Bilateral)   Anesthesia type: General   Pre-op diagnosis:      Lesion of nasal cavity     Lesion or mass of paranasal sinuses     Chronic pansinusitis   [facility]: [facility] ROOM 02 / [facility] OR   Surgeons: Skotnicki, Meghan A, DO       DISCUSSION: Thomas Macdonald is a 67 year old male who presents to PAT prior to surgery listed above.  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  Due to patient's discomfort surgery was offered.  No prior anesthesia complications.  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  Patient was evaluated by cardiology in August 2020 due to three-vessel coronary calcification seen on CT scan.  He underwent Lexiscan nuclear stress test and an echocardiogram which were both reassuring.  He was advised he could follow-up with cardiology as needed.  Also had an abnormal EKG at that time with nonspecific ST and T wave abnormalities.  EKG obtained at PAT today shows the same.  Patient has good functional status and denies chest pain. or shortness of breath.  Anticipate patient can proceed barring any acute change  VS: BP (!) 143/71   Pulse 92   Temp 36.8 C   Resp 18   Ht 6' (1.829 m)   Wt 109.4 kg   SpO2 98%   BMI 32.70 kg/m   PROVIDERS: Alysia Penna, MD   LABS: Labs reviewed: Acceptable for surgery. (all labs ordered are listed, but only abnormal results are displayed)  Labs Reviewed  GLUCOSE, CAPILLARY - Abnormal; Notable for the following components:      Result Value   Glucose-Capillary 277 (*)    All other components within normal limits  HEMOGLOBIN A1C - Abnormal; Notable for the  following components:   Hgb A1c MFr Bld 7.1 (*)    All other components within normal limits  BASIC METABOLIC PANEL - Abnormal; Notable for the following components:   Sodium 133 (*)    Potassium 3.4 (*)    Chloride 96 (*)    Glucose, Bld 275 (*)    Calcium 8.5 (*)    All other components within normal limits  CBC - Abnormal; Notable for the following components:   WBC 12.2 (*)    All other components within normal limits     IMAGES:  CT Maxillofacial 09/27/22:  IMPRESSION: 1. 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 Recommend ENT consultation for management. See recent CT neck for additional findings in the neck. 2. Left ostiomeatal unit pattern sinus disease with evidence of inspissated secretions and/or fungal colonization. Masslike opacification of left middle meatus suggests possible underlying obstructing lesion, amenable to direct inspection. The aforementioned ENT consultation is also warranted for this finding. 3. Borderline enlarged upper cervical chain lymph nodes bilaterally, which are nonspecific.   EKG 11/01/22:  NSR Nonspecific ST-T wave abnormalities Appears similar to EKG in 2020   CV:  Lexiscan ST 01/11/2019:  Lexiscan stress test was performed. Stress EKG is non-diagnostic, as this is pharmacological stress test. Normal size left ventricle. Stress LV EF is normal 57%.  SPECT stress  and rest images show small area of mild intensity, decreased tracer uptake in anterior apical myocardium. This likely represents tissue attenuation. Low risk study.   Echo 01/14/2019:  Left ventricle cavity is normal in size. Moderate concentric hypertrophy  of the left ventricle. Normal LV systolic function with EF 55%. Normal  global wall motion. Doppler evidence of grade I (impaired) diastolic  dysfunction, normal LAP.  Mild pulmonic regurgitation.  Insignificant pericardial effusion.  Normal right  atrial pressure.   Past Medical History:  Diagnosis Date   Anxiety    CAD (coronary artery disease) 12/23/2018   Chronic headaches    Chronic neck pain    Depression    Diabetes mellitus without complication (HCC)    H/O hypogonadism    Headache    HLD (hyperlipidemia)    HTN (hypertension) 12/23/2018   Hyperglycemia    Hypertension    Obesity    OSA (obstructive sleep apnea)    CPAP   Snoring    Tobacco abuse     Past Surgical History:  Procedure Laterality Date   CERVICAL SPINE SURGERY  05/13/2005   CERVICAL SPINE SURGERY     "removed spurs"- posterior cervical surgery (2nd neck surgery)   SHOULDER ARTHROSCOPY Right 05/13/2004    MEDICATIONS:  acetaminophen (TYLENOL) 500 MG tablet   ACETAMINOPHEN-BUTALBITAL 50-325 MG TABS   amLODipine (NORVASC) 5 MG tablet   aspirin EC 81 MG tablet   clonazePAM (KLONOPIN) 1 MG tablet   diclofenac Sodium (VOLTAREN) 1 % GEL   DULoxetine (CYMBALTA) 60 MG capsule   hydrochlorothiazide (HYDRODIURIL) 25 MG tablet   Liniments (DEEP BLUE RELIEF EX)   metFORMIN (GLUCOPHAGE) 500 MG tablet   Oxycodone HCl 10 MG TABS   Oxycodone HCl 10 MG TABS   rosuvastatin (CRESTOR) 10 MG tablet   tiZANidine (ZANAFLEX) 2 MG tablet   valsartan (DIOVAN) 160 MG tablet   No current facility-administered medications for this encounter.   Marcille Blanco [facility] Surgical Short Stay/Anesthesiology [facility] Phone [phone] 11/04/2022 3:04 PM

## 2022-11-05 ENCOUNTER — Ambulatory Visit (HOSPITAL_BASED_OUTPATIENT_CLINIC_OR_DEPARTMENT_OTHER): Payer: Medicare Other | Admitting: Anesthesiology

## 2022-11-05 ENCOUNTER — Ambulatory Visit (HOSPITAL_COMMUNITY)
Admission: RE | Admit: 2022-11-05 | Discharge: 2022-11-05 | Disposition: A | Discharge status: Home / Self Care | Payer: Medicare Other | Attending: Otolaryngology | Admitting: Otolaryngology

## 2022-11-05 ENCOUNTER — Ambulatory Visit (HOSPITAL_COMMUNITY): Payer: Medicare Other | Admitting: Physician Assistant

## 2022-11-05 ENCOUNTER — Encounter (HOSPITAL_COMMUNITY)
Admission: RE | Disposition: A | Discharge status: Home / Self Care | Payer: Self-pay | Source: Home / Self Care | Attending: Otolaryngology

## 2022-11-05 ENCOUNTER — Other Ambulatory Visit: Payer: Self-pay

## 2022-11-05 ENCOUNTER — Encounter (HOSPITAL_COMMUNITY): Payer: Self-pay | Admitting: Otolaryngology

## 2022-11-05 DIAGNOSIS — Z7984 Long term (current) use of oral hypoglycemic drugs: Secondary | ICD-10-CM | POA: Insufficient documentation

## 2022-11-05 DIAGNOSIS — G4733 Obstructive sleep apnea (adult) (pediatric): Secondary | ICD-10-CM | POA: Diagnosis not present

## 2022-11-05 DIAGNOSIS — R062 Wheezing: Secondary | ICD-10-CM | POA: Insufficient documentation

## 2022-11-05 DIAGNOSIS — J343 Hypertrophy of nasal turbinates: Secondary | ICD-10-CM | POA: Diagnosis not present

## 2022-11-05 DIAGNOSIS — G8929 Other chronic pain: Secondary | ICD-10-CM | POA: Insufficient documentation

## 2022-11-05 DIAGNOSIS — I10 Essential (primary) hypertension: Secondary | ICD-10-CM | POA: Diagnosis not present

## 2022-11-05 DIAGNOSIS — F32A Depression, unspecified: Secondary | ICD-10-CM | POA: Insufficient documentation

## 2022-11-05 DIAGNOSIS — J3489 Other specified disorders of nose and nasal sinuses: Secondary | ICD-10-CM | POA: Diagnosis not present

## 2022-11-05 DIAGNOSIS — I2510 Atherosclerotic heart disease of native coronary artery without angina pectoris: Secondary | ICD-10-CM | POA: Diagnosis not present

## 2022-11-05 DIAGNOSIS — J324 Chronic pansinusitis: Secondary | ICD-10-CM

## 2022-11-05 DIAGNOSIS — J329 Chronic sinusitis, unspecified: Secondary | ICD-10-CM | POA: Diagnosis not present

## 2022-11-05 DIAGNOSIS — F419 Anxiety disorder, unspecified: Secondary | ICD-10-CM | POA: Diagnosis not present

## 2022-11-05 DIAGNOSIS — E1149 Type 2 diabetes mellitus with other diabetic neurological complication: Secondary | ICD-10-CM | POA: Diagnosis not present

## 2022-11-05 DIAGNOSIS — F17210 Nicotine dependence, cigarettes, uncomplicated: Secondary | ICD-10-CM

## 2022-11-05 DIAGNOSIS — E119 Type 2 diabetes mellitus without complications: Secondary | ICD-10-CM | POA: Diagnosis not present

## 2022-11-05 DIAGNOSIS — D140 Benign neoplasm of middle ear, nasal cavity and accessory sinuses: Secondary | ICD-10-CM | POA: Diagnosis not present

## 2022-11-05 DIAGNOSIS — D367 Benign neoplasm of other specified sites: Secondary | ICD-10-CM | POA: Diagnosis not present

## 2022-11-05 HISTORY — PX: SINUS ENDO WITH FUSION: SHX5329

## 2022-11-05 HISTORY — PX: NASAL TURBINATE REDUCTION: SHX2072

## 2022-11-05 LAB — GLUCOSE, CAPILLARY
Glucose-Capillary: 140 mg/dL — ABNORMAL HIGH (ref 70–99)
Glucose-Capillary: 119 mg/dL — ABNORMAL HIGH (ref 70–99)
Glucose-Capillary: 173 mg/dL — ABNORMAL HIGH (ref 70–99)

## 2022-11-05 SURGERY — SURGERY, PARANASAL SINUS, ENDOSCOPIC, WITH NASAL SEPTOPLASTY, TURBINOPLASTY, AND MAXILLARY SINUSOTOMY
Anesthesia: General | Site: Nose | Laterality: Bilateral

## 2022-11-05 MED ORDER — SUCCINYLCHOLINE CHLORIDE 200 MG/10ML IV SOSY
PREFILLED_SYRINGE | INTRAVENOUS | Status: DC | PRN
  Administered 2022-11-05: 140 mg via INTRAVENOUS

## 2022-11-05 MED ORDER — LACTATED RINGERS IV SOLN: INTRAVENOUS | Status: DC

## 2022-11-05 MED ORDER — DEXAMETHASONE SODIUM PHOSPHATE 10 MG/ML IJ SOLN
INTRAMUSCULAR | Status: DC | PRN
  Administered 2022-11-05: 10 mg via INTRAVENOUS

## 2022-11-05 MED ORDER — LIDOCAINE-EPINEPHRINE 1 %-1:100000 IJ SOLN
INTRAMUSCULAR | Status: DC | PRN
  Administered 2022-11-05: 10 mL

## 2022-11-05 MED ORDER — IPRATROPIUM-ALBUTEROL 0.5-2.5 (3) MG/3ML IN SOLN
RESPIRATORY_TRACT | Status: AC
  Filled 2022-11-05: qty 3

## 2022-11-05 MED ORDER — ROCURONIUM BROMIDE 10 MG/ML (PF) SYRINGE
PREFILLED_SYRINGE | INTRAVENOUS | Status: DC | PRN
  Administered 2022-11-05: 40 mg via INTRAVENOUS
  Administered 2022-11-05: 30 mg via INTRAVENOUS

## 2022-11-05 MED ORDER — CEFAZOLIN SODIUM-DEXTROSE 2-4 GM/100ML-% IV SOLN
2.0000 g | INTRAVENOUS | Status: AC
  Administered 2022-11-05: 2 g via INTRAVENOUS
  Filled 2022-11-05: qty 100

## 2022-11-05 MED ORDER — FENTANYL CITRATE (PF) 250 MCG/5ML IJ SOLN
INTRAMUSCULAR | Status: DC | PRN
  Administered 2022-11-05 (×2): 50 ug via INTRAVENOUS
  Administered 2022-11-05: 100 ug via INTRAVENOUS
  Administered 2022-11-05: 50 ug via INTRAVENOUS

## 2022-11-05 MED ORDER — LIDOCAINE-EPINEPHRINE 1 %-1:100000 IJ SOLN
INTRAMUSCULAR | Status: AC
  Filled 2022-11-05: qty 1

## 2022-11-05 MED ORDER — ONDANSETRON HCL 4 MG/2ML IJ SOLN
INTRAMUSCULAR | Status: DC | PRN
  Administered 2022-11-05: 4 mg via INTRAVENOUS

## 2022-11-05 MED ORDER — SODIUM CHLORIDE 0.9 % IR SOLN
Status: DC | PRN
  Administered 2022-11-05: 1000 mL

## 2022-11-05 MED ORDER — ONDANSETRON HCL 4 MG/2ML IJ SOLN: 4.0000 mg | Freq: Once | INTRAMUSCULAR | Status: DC | PRN

## 2022-11-05 MED ORDER — EPINEPHRINE HCL (NASAL) 0.1 % NA SOLN
NASAL | Status: DC | PRN
  Administered 2022-11-05: 20 mL via TOPICAL

## 2022-11-05 MED ORDER — INSULIN ASPART 100 UNIT/ML IJ SOLN
INTRAMUSCULAR | Status: AC
  Filled 2022-11-05: qty 1

## 2022-11-05 MED ORDER — SUGAMMADEX SODIUM 200 MG/2ML IV SOLN
INTRAVENOUS | Status: DC | PRN
  Administered 2022-11-05: 400 mg via INTRAVENOUS

## 2022-11-05 MED ORDER — OXYCODONE HCL 5 MG/5ML PO SOLN: 5.0000 mg | Freq: Once | ORAL | Status: DC | PRN

## 2022-11-05 MED ORDER — ESMOLOL HCL 100 MG/10ML IV SOLN
INTRAVENOUS | Status: DC | PRN
  Administered 2022-11-05: 40 mg via INTRAVENOUS

## 2022-11-05 MED ORDER — FENTANYL CITRATE (PF) 100 MCG/2ML IJ SOLN: 25.0000 ug | INTRAMUSCULAR | Status: DC | PRN

## 2022-11-05 MED ORDER — MIDAZOLAM HCL 2 MG/2ML IJ SOLN
INTRAMUSCULAR | Status: AC
  Filled 2022-11-05: qty 2

## 2022-11-05 MED ORDER — CEFADROXIL 500 MG PO CAPS: 500.0000 mg | ORAL_CAPSULE | Freq: Two times a day (BID) | ORAL | 0 refills | Status: AC

## 2022-11-05 MED ORDER — EPINEPHRINE 1 MG/10ML IJ SOSY
PREFILLED_SYRINGE | INTRAMUSCULAR | Status: AC
  Filled 2022-11-05: qty 30

## 2022-11-05 MED ORDER — ALBUTEROL SULFATE HFA 108 (90 BASE) MCG/ACT IN AERS
INHALATION_SPRAY | RESPIRATORY_TRACT | Status: DC | PRN
  Administered 2022-11-05: 2 via RESPIRATORY_TRACT

## 2022-11-05 MED ORDER — 0.9 % SODIUM CHLORIDE (POUR BTL) OPTIME
TOPICAL | Status: DC | PRN
  Administered 2022-11-05: 1000 mL

## 2022-11-05 MED ORDER — ACETAMINOPHEN 160 MG/5ML PO SOLN: 325.0000 mg | ORAL | Status: DC | PRN

## 2022-11-05 MED ORDER — EPHEDRINE SULFATE-NACL 50-0.9 MG/10ML-% IV SOSY
PREFILLED_SYRINGE | INTRAVENOUS | Status: DC | PRN
  Administered 2022-11-05: 5 mg via INTRAVENOUS

## 2022-11-05 MED ORDER — HEMOSTATIC AGENTS (NO CHARGE) OPTIME
TOPICAL | Status: DC | PRN
  Administered 2022-11-05: 2 via TOPICAL

## 2022-11-05 MED ORDER — FENTANYL CITRATE (PF) 250 MCG/5ML IJ SOLN
INTRAMUSCULAR | Status: AC
  Filled 2022-11-05: qty 5

## 2022-11-05 MED ORDER — LABETALOL HCL 5 MG/ML IV SOLN
INTRAVENOUS | Status: DC | PRN
  Administered 2022-11-05 (×2): 10 mg via INTRAVENOUS

## 2022-11-05 MED ORDER — ORAL CARE MOUTH RINSE: 15.0000 mL | Freq: Once | OROMUCOSAL | Status: AC

## 2022-11-05 MED ORDER — IPRATROPIUM-ALBUTEROL 0.5-2.5 (3) MG/3ML IN SOLN
3.0000 mL | Freq: Once | RESPIRATORY_TRACT | Status: AC
  Administered 2022-11-05: 3 mL via RESPIRATORY_TRACT

## 2022-11-05 MED ORDER — LIDOCAINE 2% (20 MG/ML) 5 ML SYRINGE
INTRAMUSCULAR | Status: DC | PRN
  Administered 2022-11-05: 100 mg via INTRAVENOUS

## 2022-11-05 MED ORDER — MEPERIDINE HCL 25 MG/ML IJ SOLN: 6.2500 mg | INTRAMUSCULAR | Status: DC | PRN

## 2022-11-05 MED ORDER — PHENYLEPHRINE 80 MCG/ML (10ML) SYRINGE FOR IV PUSH (FOR BLOOD PRESSURE SUPPORT)
PREFILLED_SYRINGE | INTRAVENOUS | Status: DC | PRN
  Administered 2022-11-05: 160 ug via INTRAVENOUS

## 2022-11-05 MED ORDER — PROPOFOL 10 MG/ML IV BOLUS
INTRAVENOUS | Status: DC | PRN
  Administered 2022-11-05: 160 mg via INTRAVENOUS

## 2022-11-05 MED ORDER — INSULIN ASPART 100 UNIT/ML IJ SOLN
0.0000 [IU] | INTRAMUSCULAR | Status: DC | PRN
  Administered 2022-11-05: 2 [IU] via SUBCUTANEOUS

## 2022-11-05 MED ORDER — ACETAMINOPHEN 325 MG PO TABS: 325.0000 mg | ORAL_TABLET | ORAL | Status: DC | PRN

## 2022-11-05 MED ORDER — MIDAZOLAM HCL 2 MG/2ML IJ SOLN
INTRAMUSCULAR | Status: DC | PRN
  Administered 2022-11-05: 2 mg via INTRAVENOUS

## 2022-11-05 MED ORDER — SURGIFLO WITH THROMBIN (HEMOSTATIC MATRIX KIT) OPTIME
TOPICAL | Status: DC | PRN
  Administered 2022-11-05: 1 via TOPICAL

## 2022-11-05 MED ORDER — CHLORHEXIDINE GLUCONATE 0.12 % MT SOLN
15.0000 mL | Freq: Once | OROMUCOSAL | Status: AC
  Administered 2022-11-05: 15 mL via OROMUCOSAL
  Filled 2022-11-05: qty 15

## 2022-11-05 MED ORDER — PREDNISONE 10 MG PO TABS: ORAL_TABLET | ORAL | 0 refills | Status: AC

## 2022-11-05 MED ORDER — OXYCODONE HCL 5 MG PO TABS: 5.0000 mg | ORAL_TABLET | Freq: Once | ORAL | Status: DC | PRN

## 2022-11-05 MED ORDER — PROPOFOL 10 MG/ML IV BOLUS
INTRAVENOUS | Status: AC
  Filled 2022-11-05: qty 20

## 2022-11-05 SURGICAL SUPPLY — 53 items
AGENT HMST KT MTR STRL THRMB (HEMOSTASIS) ×1
ATTRACTOMAT 16X20 MAGNETIC DRP (DRAPES) IMPLANT
BAG COUNTER SPONGE SURGICOUNT (BAG) ×1 IMPLANT
BAG SPNG CNTER NS LX DISP (BAG) ×1
BALLN FRONTAL NUVENT 6X17 (BALLOONS)
BALLN FRONTAL NUVENT 6X17 70D (BALLOONS) ×1
BALLOON FRONTAL NUVENT 6X17 (BALLOONS) IMPLANT
BALLOON FRONTAL NVNT 6X17 70D (BALLOONS) IMPLANT
BLADE INF TURB ROT M4 2 5PK (BLADE) IMPLANT
BLADE ROTATE RAD 40 4 M4 (BLADE) IMPLANT
BLADE ROTATE TRICUT 4X13 M4 (BLADE) ×1 IMPLANT
BLADE SURG 15 STRL LF DISP TIS (BLADE) IMPLANT
BLADE SURG 15 STRL SS (BLADE)
CANISTER SUCT 3000ML PPV (MISCELLANEOUS) ×2 IMPLANT
COAGULATOR SUCT 8FR VV (MISCELLANEOUS) IMPLANT
COAGULATOR SUCT SWTCH 10FR 6 (ELECTROSURGICAL) IMPLANT
DRAPE HALF SHEET 40X57 (DRAPES) IMPLANT
DRSG NASOPORE 8CM (GAUZE/BANDAGES/DRESSINGS) IMPLANT
ELECT COATED BLADE 2.86 ST (ELECTRODE) IMPLANT
ELECT REM PT RETURN 9FT ADLT (ELECTROSURGICAL) ×1
ELECTRODE REM PT RTRN 9FT ADLT (ELECTROSURGICAL) ×1 IMPLANT
FILTER ARTHROSCOPY CONVERTOR (FILTER) ×1 IMPLANT
GLOVE BIO SURGEON STRL SZ 6.5 (GLOVE) ×1 IMPLANT
HEMOSTAT ARISTA ABSORB 3G PWDR (HEMOSTASIS) IMPLANT
INFLATOR BALLOON W/TUBE (BALLOONS) IMPLANT
KIT BASIN OR (CUSTOM PROCEDURE TRAY) ×1 IMPLANT
KIT TURNOVER KIT B (KITS) ×1 IMPLANT
NDL HYPO 25GX1X1/2 BEV (NEEDLE) ×2 IMPLANT
NDL SPNL 25GX3.5 QUINCKE BL (NEEDLE) ×1 IMPLANT
NEEDLE HYPO 25GX1X1/2 BEV (NEEDLE) ×2 IMPLANT
NEEDLE SPNL 25GX3.5 QUINCKE BL (NEEDLE) ×1 IMPLANT
NS IRRIG 1000ML POUR BTL (IV SOLUTION) ×1 IMPLANT
PAD ARMBOARD 7.5X6 YLW CONV (MISCELLANEOUS) ×2 IMPLANT
PATTIES SURGICAL .5 X3 (DISPOSABLE) ×1 IMPLANT
PENCIL SMOKE EVACUATOR (MISCELLANEOUS) IMPLANT
SOL ANTI FOG 6CC (MISCELLANEOUS) ×1 IMPLANT
SPLINT NASAL DOYLE BI-VL (GAUZE/BANDAGES/DRESSINGS) IMPLANT
SPLINT NASAL POSISEP X .6X2 (GAUZE/BANDAGES/DRESSINGS) ×1 IMPLANT
SURGIFLO W/THROMBIN 8M KIT (HEMOSTASIS) IMPLANT
SUT CHROMIC 4 0 P 3 18 (SUTURE) IMPLANT
SUT PLAIN 4 0 ~~LOC~~ 1 (SUTURE) IMPLANT
SUT SILK 2 0 SH (SUTURE) ×1 IMPLANT
SWAB COLLECTION DEVICE MRSA (MISCELLANEOUS) IMPLANT
SWAB CULTURE ESWAB REG 1ML (MISCELLANEOUS) IMPLANT
SYR CONTROL 10ML LL (SYRINGE) IMPLANT
SYR TB 1ML LUER SLIP (SYRINGE) ×2 IMPLANT
TOWEL GREEN STERILE FF (TOWEL DISPOSABLE) ×1 IMPLANT
TRACKER ENT INSTRUMENT (MISCELLANEOUS) ×2 IMPLANT
TRACKER ENT PATIENT (MISCELLANEOUS) ×1 IMPLANT
TRAY ENT MC OR (CUSTOM PROCEDURE TRAY) ×1 IMPLANT
TUBE CONNECTING 12X1/4 (SUCTIONS) ×1 IMPLANT
TUBING EXTENTION W/L.L. (IV SETS) IMPLANT
TUBING STRAIGHTSHOT EPS 5PK (TUBING) ×1 IMPLANT

## 2022-11-05 NOTE — Transfer of Care (Signed)
Immediate Anesthesia Transfer of Care Note  Patient: Jerol Rufener  Procedure(s) Performed: SINUS ENDO WITH FUSION (Bilateral: Nose) TURBINATE REDUCTION/SUBMUCOSAL RESECTION (Bilateral: Nose)  Patient Location: PACU  Anesthesia Type:General  Level of Consciousness: drowsy  Airway & Oxygen Therapy: Patient connected to face mask oxygen  Post-op Assessment: Report given to RN, Post -op Vital signs reviewed and stable, and Patient moving all extremities  Post vital signs: stable  Last Vitals:  Vitals Value Taken Time  BP    Temp    Pulse    Resp    SpO2      Last Pain:  Vitals:   11/05/22 1255  TempSrc:   PainSc: 0-No pain         Complications:  Encounter Notable Events  Notable Event Outcome Phase Comment  Difficult to intubate - expected  Intraprocedure Filed from anesthesia note documentation.

## 2022-11-05 NOTE — Op Note (Signed)
OPERATIVE NOTE  Thomas Macdonald Date/Time of Admission: 11/05/2022 12:04 PM  CSN: [id number];[id number] Attending Provider: Cheron Schaumann A, DO Room/Bed: MCPO/NONE DOB: May 21, 1955 Age: 67 y.o.   Pre-Op Diagnosis: Lesion of nasal cavity Lesion or mass of paranasal sinuses Chronic pansinusitis  Post-Op Diagnosis: Lesion of nasal cavity Lesion or mass of paranasal sinuses Chronic pansinusitis  Procedure: Procedure(s): FUNCTIONAL ENDOSCOPIC SINUS SURGERY WITH LEFT MAXILLARY ANTROSTOMY WITH TISSUE REMOVAL, LEFT TOTAL ETHMOIDECTOMY, BILATERAL FRONTAL RECESS EXPLORATION, BILATERAL INFERIOR TURBINATE REDUCTION WITH IMAGE GUIDANCE  Anesthesia: General  Surgeon(s): Ryelynn Guedea A Arvis Zwahlen, DO  Staff: Circulator: Ermalinda Barrios, RN Scrub Person: Madilyn Fireman, Amy E Vendor Representative : Debbe Mounts  Implants: * No implants in log *  Specimens: ID Type Source Tests Collected by Time Destination  1 : Left nasal cavity mass Tissue PATH Sinus Contents/Nasal Polyps SURGICAL PATHOLOGY Erskine Steinfeldt A, DO 11/05/2022 1524   2 : Left sinus contents Tissue PATH Sinus Contents/Nasal Polyps SURGICAL PATHOLOGY Davan Hark A, DO 11/05/2022 1525     Complications: None  EBL: 50 ML  Condition: stable  Operative Findings:  Vascular polypoid mass filling frontal recess, extending to below the level of the middle turbinate, obstructing left maxillary ostia  Description of Operation: Once operative consent was obtained and the site and surgery were confirmed with the patient and the operating room team, the patient was brought back to the operating room and general endotracheal anesthesia was obtained. The patient was turned over to the ENT service, at which time the image-guided system was attached and noted to be in good calibration. Lidocaine 1% with 1:100,000 epinephrine was injected into the nasal septum bilaterally, inferior turbinates bilaterally, the middle turbinates  bilaterally, and the axilla between the medial turbinate and the lateral nasal wall. Afrin-soaked pledgets were placed into the nasal cavity, and the patient was prepped and draped in sterile fashion. Attention was turned to the right-sided sinonasal cavity. The middle turbinate was medialized and a ball-tipped seeker was used to slightly anterior fracture the uncinate process. The navigation frontal balloon was used to locate the frontal recess and confirm placement in the frontal sinus, and the frontal recess was dilated at 2 locations to 6 mm. The recess was then widenend with care taken to avoid circumfrenital trauma. Attention was then turned to the left sinonasal cavity. The mass lesion was noted and biopsies were taken, and sent as a fresh specimen to pathology. It was resected until normal anatomic landmarks could be identified. The middle turbinate was medialized and a ball-tipped seeker was used to slightly anterior fracture the uncinate process. The navigation frontal balloon was used to locate the frontal recess and confirm placement in the frontal sinus, and the frontal recess was dilated at 2 locations to 6 mm. The recess was then widenend with care taken to avoid circumfrenital trauma. Attention was then turned to the uncinate process, which was completely fractured anteriorly and then removed with a combination of backbiter and a microdebrider. A ball-tipped seeker was used to identify the natural os of the maxillary sinus, and it was widened with combination of the backbiter, the microdebrider, the olive tip suction, and the straight TruCut until it was widely patent. Utilizing image-guided suction, the medial inferior quadrant of the ethmoid bulla was entered bluntly, the walls were fractured and the bulla was removed utilizing a microdebrider. Anterior and posterior ethmoidectomy were completed in a standard fashion by first locating the anterior face of the sphenoid sinus and then following the  skull base and  the lamina papyracea to fully take down all ethmoid cells. This was done utilizing a combination of the straight suction, the J curette, the ball-tipped seeker, and the microdebrider.  Attention was then turned to the inferior turbinates bilaterally. They were outfractured and then submucous resection was performed by making an incision in the leading edge with a 15 blade, separating the mucosa from bone with a Cottle elevator and then using the micro debrider via a turbinate blade to remove bone. Pledgets were removed, copious irrigation was placed in the sinonasal cavities and Arista and Posisep absorbable nasal pack was placed lateral to the left middle turbinate in the axilla between it and the lateral nasal wall. An orogastric tube was placed and the stomach cavity was suctioned to reduce postoperative nausea. The patient was turned over to anesthesia service and was extubated in the operating room and transferred to the PACU in stable condition. The patient will be discharged today and followed up in the ENT clinic in 1 week for postoperative check.   Laren Boom, DO [facility] ENT  11/05/2022

## 2022-11-05 NOTE — Anesthesia Procedure Notes (Signed)
Procedure Name: Intubation Date/Time: 11/05/2022 3:15 PM  Performed by: Kayleen Memos, CRNAPre-anesthesia Checklist: Patient identified, Emergency Drugs available, Suction available, Patient being monitored and Timeout performed Patient Re-evaluated:Patient Re-evaluated prior to induction Oxygen Delivery Method: Circle system utilized Preoxygenation: Pre-oxygenation with 100% oxygen Induction Type: IV induction Ventilation: Oral airway inserted - appropriate to patient size, Mask ventilation with difficulty and Two handed mask ventilation required Laryngoscope Size: Glidescope and 4 Grade View: Grade I Tube type: Oral Tube size: 8.0 mm Number of attempts: 1 Airway Equipment and Method: Rigid stylet and Video-laryngoscopy Placement Confirmation: ETT inserted through vocal cords under direct vision, positive ETCO2, CO2 detector and breath sounds checked- equal and bilateral Secured at: 23 cm Tube secured with: Tape Dental Injury: Teeth and Oropharynx as per pre-operative assessment  Difficulty Due To: Difficulty was anticipated

## 2022-11-05 NOTE — Anesthesia Postprocedure Evaluation (Signed)
Anesthesia Post Note  Patient: Thomas Macdonald  Procedure(s) Performed: SINUS ENDO WITH FUSION (Bilateral: Nose) TURBINATE REDUCTION/SUBMUCOSAL RESECTION (Bilateral: Nose)     Patient location during evaluation: PACU Anesthesia Type: General Level of consciousness: awake and alert Pain management: pain level controlled Vital Signs Assessment: post-procedure vital signs reviewed and stable Respiratory status: spontaneous breathing, nonlabored ventilation, respiratory function stable and patient connected to nasal cannula oxygen Cardiovascular status: blood pressure returned to baseline and stable Postop Assessment: no apparent nausea or vomiting Anesthetic complications: yes   Encounter Notable Events  Notable Event Outcome Phase Comment  Difficult to intubate - expected  Intraprocedure Filed from anesthesia note documentation.    Last Vitals:  Vitals:   11/05/22 1715 11/05/22 1730  BP: 133/74 125/71  Pulse: 71 73  Resp: 14 20  Temp:  36.5 C  SpO2: 90% 99%    Last Pain:  Vitals:   11/05/22 1639  TempSrc:   PainSc: 0-No pain                 Trevor Iha

## 2022-11-05 NOTE — H&P (Signed)
Thomas Macdonald is an 67 y.o. male.    Chief Complaint:  Nasal mass  HPI: Patient presents today for planned elective procedure.  He denies any interval change in history since office visit on 10/18/2022:  Thomas Macdonald is a 67 y.o. male who presents as a return patient, referred by No ref. provider found, for follow-up discussion after recent core needle biopsy. Patient's wife and daughter accompany him on visit today. Patient feels as though the left parotid lesion fluctuates in size, and occasionally appears bigger and then smaller. At this time, he denies significant pain in association with the lesion. Patient endorses history of chronic back pain as well as cervical spine surgery. He is not currently experiencing symptoms of facial pain, pressure, nasal obstruction or nasal bleeding. Patient has history of obstructive sleep apnea, but is not currently wearing his CPAP, as he does not tolerate it well.   Past Medical History:  Diagnosis Date   Anxiety    CAD (coronary artery disease) 12/23/2018   Chronic headaches    Chronic neck pain    Depression    Diabetes mellitus without complication (HCC)    H/O hypogonadism    Headache    HLD (hyperlipidemia)    HTN (hypertension) 12/23/2018   Hyperglycemia    Hypertension    Obesity    OSA (obstructive sleep apnea)    CPAP   Snoring    Tobacco abuse     Past Surgical History:  Procedure Laterality Date   CERVICAL SPINE SURGERY  05/13/2005   CERVICAL SPINE SURGERY     "removed spurs"- posterior cervical surgery (2nd neck surgery)   SHOULDER ARTHROSCOPY Right 05/13/2004    Family History  Adopted: Yes    Social History:  reports that he has been smoking cigarettes. He has been smoking an average of 1 pack per day. He has never used smokeless tobacco. He reports that he does not drink alcohol and does not use drugs.  Allergies:  Allergies  Allergen Reactions   Oxymorphone Other (See Comments)    Irregular  heartbeat,hotflash    Medications Prior to Admission  Medication Sig Dispense Refill   acetaminophen (TYLENOL) 500 MG tablet Take 500 mg by mouth every 6 (six) hours as needed for headache.     amLODipine (NORVASC) 5 MG tablet Take 5 mg by mouth daily.     aspirin EC 81 MG tablet Take 81 mg by mouth daily as needed (Headache).     clonazePAM (KLONOPIN) 1 MG tablet Take 0.5 mg by mouth 2 (two) times daily as needed for anxiety.     DULoxetine (CYMBALTA) 60 MG capsule Take 60 mg by mouth daily.     hydrochlorothiazide (HYDRODIURIL) 25 MG tablet Take 25 mg by mouth daily.     Liniments (DEEP BLUE RELIEF EX) Apply 1 Application topically every other day.     metFORMIN (GLUCOPHAGE) 500 MG tablet Take 500 mg by mouth 2 (two) times daily with a meal.     Oxycodone HCl 10 MG TABS Take 1 tablet (10 mg total) by mouth every 4 (four) hours as needed for pain. 180 tablet 0   Oxycodone HCl 10 MG TABS Take 1 tablet (10 mg total) by mouth every 4 (four) hours as needed for pain. (Patient taking differently: Take 10 mg by mouth every 4 (four) hours as needed (Pain).) 180 tablet 0   rosuvastatin (CRESTOR) 10 MG tablet Take 1 tablet (10 mg total) by mouth daily. (Patient taking differently: Take 5  mg by mouth daily.) 30 tablet 2   tiZANidine (ZANAFLEX) 2 MG tablet Take 1 tablet (2 mg total) by mouth daily as needed. (Patient taking differently: Take 2 mg by mouth daily as needed for muscle spasms.) 30 tablet 1   valsartan (DIOVAN) 160 MG tablet Take 160 mg by mouth daily.     ACETAMINOPHEN-BUTALBITAL 50-325 MG TABS Take 1 tablet by mouth 2 (two) times daily as needed. (Patient taking differently: Take 1 tablet by mouth 2 (two) times daily as needed (Headache).) 30 tablet 1   diclofenac Sodium (VOLTAREN) 1 % GEL Apply 4 grams to skin four times a day as directed (Patient not taking: Reported on 10/29/2022) 480 g 5    No results found for this or any previous visit (from the past 48 hour(s)). No results  found.  ROS: ROS  Blood pressure (!) 141/88, pulse 83, temperature 98.5 F (36.9 C), temperature source Oral, resp. rate 17, height 6' (1.829 m), weight 108.9 kg, SpO2 96 %.  PHYSICAL EXAM: Physical Exam Constitutional:      Appearance: Normal appearance.  HENT:     Right Ear: External ear normal.     Left Ear: External ear normal.  Pulmonary:     Effort: Pulmonary effort is normal.  Neurological:     General: No focal deficit present.  Psychiatric:        Mood and Affect: Mood normal.     Studies Reviewed: CT Sinus reviewed   Assessment/Plan Thomas Macdonald is a 67 y.o. male with Left nasal obstruction Nasal endoscopy performed demonstrated polypoid and soft tissue mass in left nasal cavity, appears to involve the middle turbinate. Does not obstruct nasal cavity. Left middle meatus obstructed. Previously performed imaging of face and sinuses demonstrated opacification of the left ethmoid, bilateral frontal and obstruction of the left ostiomeatal complex.  -To OR today for functional endoscopic sinus surgery with left total ethmoidectomy left maxillary antrostomy with tissue removal and bilateral nasal frontal recess exploration with bilateral inferior turbinate reduction. Risks of surgery and recovery were reviewed with patient. All questions answered.   Thomas Macdonald A Thomas Macdonald 11/05/2022, 1:51 PM

## 2022-11-05 NOTE — Discharge Instructions (Signed)
[facility] ([facility]) Post Operative Instructions  Office: [phone]  The Surgery Itself Endoscopic sinus surgery (with or without septoplasty and turbinate reduction) involves general anesthesia, typically for one to two hours. Patients may be sedated for several hours after surgery and may remain sleepy for the better part of the day. Nausea and vomiting are occasionally seen, and usually resolve by the evening of surgery - even without additional medications. Almost all patients can go home the day of surgery.  After Surgery  Facial pressure and fullness similar to a sinus infection/headache is normal after surgery. Breathing through your nose is also difficult due to swelling. A humidifier or vaporizer can be used in the bedroom to prevent throat pain with mouth breathing.   Bloody nasal drainage is normal after this surgery for 5-7 days, usually decreasing in volume with each day that passes. Drainage will flow from the front of the nose and down the back of the throat. Make sure you spit out blood drainage that drips down the back of your throat to prevent nausea/vomiting. You will have a nasal drip pad/sling with gauze to catch drainage from the front of your nose. The dressing may need to be changed frequently during the first 24 hours following surgery. In case of profuse nasal bleeding, you may apply ice to the bridge of the nose and pinch the nose just above the tip and hold for 10 minutes; if bleeding continues, contact the doctors office.   Frequent hot showers or saline nasal rinses (NeilMed) will help break up congestion and clear any clot or mucus that builds up within the nose after surgery. This can be started the day after surgery.   It is more comfortable to sleep with extra pillows or in a recliner for the first few days after surgery until the drainage begins to resolve.    Do not blow your nose for 2 weeks after surgery.   Avoid lifting > 10 lbs. and no  vigorous exercise for 2 weeks after surgery.   Avoid airplane travel for 2 weeks following sinus surgery; the cabin pressure changes can cause pain and swelling within the nose/sinuses.   Sense of smell and taste are often diminished for several weeks after surgery. There may be some tenderness or numbness in your upper front teeth, which is normal after surgery. You may express old clot, discolored mucus or very large nasal crusts from your nose for up to 3-4 weeks after surgery; depending on how frequently and how effectively you irrigate your nose with the saltwater spray.   You may have absorbable sutures inside of your nose after surgery that will slowly dissolve in 2-3 weeks. Be careful when clearing crusts from the nose since they may be attached to these sutures.  Medications  Pain medication can be used for pain as prescribed. Pain and pressure in the nose is expected after surgery. As the surgical site heals, pain will resolve over the course of a week. Pain medications can cause nausea, which can be prevented if you take them with food or milk.   You may be given an antibiotic for one week after surgery to prevent infection. Take this medication with food to prevent nausea or vomiting.   You can use 2 nasal sprays after surgery: Afrin can be used up to 2 times a day for up to 5 days after surgery (best before bed) to reduce bloody drainage from the nose for the first few days after surgery. Saline/salt  water spray should be used at least 4-6 times per day, starting the day after surgery to prevent crusting inside of the nose.   Take all of your routine medications as prescribed, unless told otherwise by your surgeon. Any medications that thin the blood should be avoided. This includes aspirin. Avoid aspirin-like products for the first 48 hours after surgery (Advil, Motrin, Excedrin, Alieve, Celebrex, Naprosyn), but you may use them as needed for pain after 48 hours.

## 2022-11-06 ENCOUNTER — Encounter (HOSPITAL_COMMUNITY): Payer: Self-pay | Admitting: Otolaryngology

## 2022-11-06 LAB — GLUCOSE, CAPILLARY: Glucose-Capillary: 72 mg/dL (ref 70–99)

## 2022-11-07 LAB — SURGICAL PATHOLOGY

## 2022-11-15 ENCOUNTER — Other Ambulatory Visit (HOSPITAL_COMMUNITY): Payer: Self-pay

## 2022-11-15 ENCOUNTER — Other Ambulatory Visit: Payer: Self-pay

## 2022-11-18 ENCOUNTER — Other Ambulatory Visit (HOSPITAL_COMMUNITY): Payer: Self-pay

## 2022-11-18 DIAGNOSIS — J3489 Other specified disorders of nose and nasal sinuses: Secondary | ICD-10-CM | POA: Diagnosis not present

## 2022-11-18 DIAGNOSIS — K118 Other diseases of salivary glands: Secondary | ICD-10-CM | POA: Diagnosis not present

## 2022-11-18 DIAGNOSIS — D119 Benign neoplasm of major salivary gland, unspecified: Secondary | ICD-10-CM | POA: Diagnosis not present

## 2022-11-18 DIAGNOSIS — J324 Chronic pansinusitis: Secondary | ICD-10-CM | POA: Diagnosis not present

## 2022-11-19 DIAGNOSIS — M6281 Muscle weakness (generalized): Secondary | ICD-10-CM | POA: Diagnosis not present

## 2022-11-19 DIAGNOSIS — R2689 Other abnormalities of gait and mobility: Secondary | ICD-10-CM | POA: Diagnosis not present

## 2022-11-21 DIAGNOSIS — M6281 Muscle weakness (generalized): Secondary | ICD-10-CM | POA: Diagnosis not present

## 2022-11-21 DIAGNOSIS — R2689 Other abnormalities of gait and mobility: Secondary | ICD-10-CM | POA: Diagnosis not present

## 2022-11-25 DIAGNOSIS — J3489 Other specified disorders of nose and nasal sinuses: Secondary | ICD-10-CM | POA: Diagnosis not present

## 2022-11-25 DIAGNOSIS — D119 Benign neoplasm of major salivary gland, unspecified: Secondary | ICD-10-CM | POA: Diagnosis not present

## 2022-11-25 DIAGNOSIS — K118 Other diseases of salivary glands: Secondary | ICD-10-CM | POA: Diagnosis not present

## 2022-11-26 DIAGNOSIS — R2689 Other abnormalities of gait and mobility: Secondary | ICD-10-CM | POA: Diagnosis not present

## 2022-11-26 DIAGNOSIS — M6281 Muscle weakness (generalized): Secondary | ICD-10-CM | POA: Diagnosis not present

## 2022-11-27 DIAGNOSIS — M6281 Muscle weakness (generalized): Secondary | ICD-10-CM | POA: Diagnosis not present

## 2022-11-27 DIAGNOSIS — R2689 Other abnormalities of gait and mobility: Secondary | ICD-10-CM | POA: Diagnosis not present

## 2022-11-28 ENCOUNTER — Other Ambulatory Visit: Payer: Self-pay | Admitting: Internal Medicine

## 2022-11-28 DIAGNOSIS — F17200 Nicotine dependence, unspecified, uncomplicated: Secondary | ICD-10-CM

## 2022-11-28 DIAGNOSIS — Z720 Tobacco use: Secondary | ICD-10-CM

## 2022-12-02 DIAGNOSIS — M6281 Muscle weakness (generalized): Secondary | ICD-10-CM | POA: Diagnosis not present

## 2022-12-02 DIAGNOSIS — R2689 Other abnormalities of gait and mobility: Secondary | ICD-10-CM | POA: Diagnosis not present

## 2022-12-04 DIAGNOSIS — R2689 Other abnormalities of gait and mobility: Secondary | ICD-10-CM | POA: Diagnosis not present

## 2022-12-04 DIAGNOSIS — M6281 Muscle weakness (generalized): Secondary | ICD-10-CM | POA: Diagnosis not present

## 2022-12-09 ENCOUNTER — Other Ambulatory Visit: Payer: Self-pay

## 2022-12-09 ENCOUNTER — Other Ambulatory Visit (HOSPITAL_COMMUNITY): Payer: Self-pay

## 2022-12-09 DIAGNOSIS — R2689 Other abnormalities of gait and mobility: Secondary | ICD-10-CM | POA: Diagnosis not present

## 2022-12-09 DIAGNOSIS — M6281 Muscle weakness (generalized): Secondary | ICD-10-CM | POA: Diagnosis not present

## 2022-12-09 MED ORDER — OXYCODONE HCL 10 MG PO TABS
10.0000 mg | ORAL_TABLET | ORAL | 0 refills | Status: AC | PRN
  Filled 2022-12-18: qty 180, 30d supply, fill #0

## 2022-12-09 MED ORDER — OXYCODONE HCL 10 MG PO TABS
10.0000 mg | ORAL_TABLET | ORAL | 0 refills | Status: AC | PRN
  Filled 2023-01-16: qty 180, 30d supply, fill #0

## 2022-12-09 MED ORDER — TIZANIDINE HCL 2 MG PO TABS
2.0000 mg | ORAL_TABLET | Freq: Every day | ORAL | 1 refills | Status: AC | PRN
  Filled 2022-12-09: qty 30, 30d supply, fill #0
  Filled 2023-01-16: qty 30, 30d supply, fill #1

## 2022-12-11 DIAGNOSIS — M6281 Muscle weakness (generalized): Secondary | ICD-10-CM | POA: Diagnosis not present

## 2022-12-11 DIAGNOSIS — R2689 Other abnormalities of gait and mobility: Secondary | ICD-10-CM | POA: Diagnosis not present

## 2022-12-16 DIAGNOSIS — R2689 Other abnormalities of gait and mobility: Secondary | ICD-10-CM | POA: Diagnosis not present

## 2022-12-16 DIAGNOSIS — M6281 Muscle weakness (generalized): Secondary | ICD-10-CM | POA: Diagnosis not present

## 2022-12-18 ENCOUNTER — Other Ambulatory Visit (HOSPITAL_COMMUNITY): Payer: Self-pay

## 2022-12-18 DIAGNOSIS — R2689 Other abnormalities of gait and mobility: Secondary | ICD-10-CM | POA: Diagnosis not present

## 2022-12-18 DIAGNOSIS — M6281 Muscle weakness (generalized): Secondary | ICD-10-CM | POA: Diagnosis not present

## 2022-12-23 DIAGNOSIS — R2689 Other abnormalities of gait and mobility: Secondary | ICD-10-CM | POA: Diagnosis not present

## 2022-12-23 DIAGNOSIS — M6281 Muscle weakness (generalized): Secondary | ICD-10-CM | POA: Diagnosis not present

## 2022-12-24 ENCOUNTER — Other Ambulatory Visit (HOSPITAL_COMMUNITY): Payer: Self-pay

## 2022-12-24 DIAGNOSIS — E278 Other specified disorders of adrenal gland: Secondary | ICD-10-CM | POA: Diagnosis not present

## 2022-12-24 DIAGNOSIS — J3489 Other specified disorders of nose and nasal sinuses: Secondary | ICD-10-CM | POA: Diagnosis not present

## 2022-12-24 DIAGNOSIS — I10 Essential (primary) hypertension: Secondary | ICD-10-CM | POA: Diagnosis not present

## 2022-12-24 DIAGNOSIS — I700 Atherosclerosis of aorta: Secondary | ICD-10-CM | POA: Diagnosis not present

## 2022-12-24 DIAGNOSIS — F331 Major depressive disorder, recurrent, moderate: Secondary | ICD-10-CM | POA: Diagnosis not present

## 2022-12-24 DIAGNOSIS — R911 Solitary pulmonary nodule: Secondary | ICD-10-CM | POA: Diagnosis not present

## 2022-12-24 DIAGNOSIS — D119 Benign neoplasm of major salivary gland, unspecified: Secondary | ICD-10-CM | POA: Diagnosis not present

## 2022-12-24 DIAGNOSIS — F17200 Nicotine dependence, unspecified, uncomplicated: Secondary | ICD-10-CM | POA: Diagnosis not present

## 2022-12-24 DIAGNOSIS — R296 Repeated falls: Secondary | ICD-10-CM | POA: Diagnosis not present

## 2022-12-24 DIAGNOSIS — I2510 Atherosclerotic heart disease of native coronary artery without angina pectoris: Secondary | ICD-10-CM | POA: Diagnosis not present

## 2022-12-24 DIAGNOSIS — E1169 Type 2 diabetes mellitus with other specified complication: Secondary | ICD-10-CM | POA: Diagnosis not present

## 2022-12-24 DIAGNOSIS — F419 Anxiety disorder, unspecified: Secondary | ICD-10-CM | POA: Diagnosis not present

## 2022-12-24 MED ORDER — CLONAZEPAM 0.5 MG PO TABS
0.5000 mg | ORAL_TABLET | Freq: Two times a day (BID) | ORAL | 0 refills | Status: AC | PRN
  Filled 2022-12-24: qty 90, 45d supply, fill #0

## 2022-12-25 ENCOUNTER — Other Ambulatory Visit (HOSPITAL_COMMUNITY): Payer: Self-pay

## 2022-12-26 DIAGNOSIS — R2689 Other abnormalities of gait and mobility: Secondary | ICD-10-CM | POA: Diagnosis not present

## 2022-12-26 DIAGNOSIS — M6281 Muscle weakness (generalized): Secondary | ICD-10-CM | POA: Diagnosis not present

## 2022-12-30 DIAGNOSIS — R2689 Other abnormalities of gait and mobility: Secondary | ICD-10-CM | POA: Diagnosis not present

## 2022-12-30 DIAGNOSIS — M6281 Muscle weakness (generalized): Secondary | ICD-10-CM | POA: Diagnosis not present

## 2023-01-01 DIAGNOSIS — M6281 Muscle weakness (generalized): Secondary | ICD-10-CM | POA: Diagnosis not present

## 2023-01-01 DIAGNOSIS — R2689 Other abnormalities of gait and mobility: Secondary | ICD-10-CM | POA: Diagnosis not present

## 2023-01-02 ENCOUNTER — Ambulatory Visit
Admission: RE | Admit: 2023-01-02 | Discharge: 2023-01-02 | Disposition: A | Discharge status: Home / Self Care | Payer: Medicare Other | Source: Ambulatory Visit | Attending: Internal Medicine | Admitting: Internal Medicine

## 2023-01-02 DIAGNOSIS — F17200 Nicotine dependence, unspecified, uncomplicated: Secondary | ICD-10-CM

## 2023-01-02 DIAGNOSIS — Z720 Tobacco use: Secondary | ICD-10-CM

## 2023-01-02 DIAGNOSIS — Z87891 Personal history of nicotine dependence: Secondary | ICD-10-CM | POA: Diagnosis not present

## 2023-01-06 DIAGNOSIS — M6281 Muscle weakness (generalized): Secondary | ICD-10-CM | POA: Diagnosis not present

## 2023-01-06 DIAGNOSIS — R2689 Other abnormalities of gait and mobility: Secondary | ICD-10-CM | POA: Diagnosis not present

## 2023-01-16 ENCOUNTER — Other Ambulatory Visit (HOSPITAL_COMMUNITY): Payer: Self-pay

## 2023-01-17 DIAGNOSIS — R2689 Other abnormalities of gait and mobility: Secondary | ICD-10-CM | POA: Diagnosis not present

## 2023-01-17 DIAGNOSIS — M6281 Muscle weakness (generalized): Secondary | ICD-10-CM | POA: Diagnosis not present

## 2023-01-18 ENCOUNTER — Other Ambulatory Visit (HOSPITAL_COMMUNITY): Payer: Self-pay

## 2023-01-21 DIAGNOSIS — M6281 Muscle weakness (generalized): Secondary | ICD-10-CM | POA: Diagnosis not present

## 2023-01-21 DIAGNOSIS — R2689 Other abnormalities of gait and mobility: Secondary | ICD-10-CM | POA: Diagnosis not present

## 2023-01-22 ENCOUNTER — Other Ambulatory Visit (HOSPITAL_COMMUNITY): Payer: Self-pay

## 2023-01-22 DIAGNOSIS — G4733 Obstructive sleep apnea (adult) (pediatric): Secondary | ICD-10-CM | POA: Diagnosis not present

## 2023-01-22 DIAGNOSIS — I10 Essential (primary) hypertension: Secondary | ICD-10-CM | POA: Diagnosis not present

## 2023-01-22 DIAGNOSIS — F419 Anxiety disorder, unspecified: Secondary | ICD-10-CM | POA: Diagnosis not present

## 2023-01-22 DIAGNOSIS — E559 Vitamin D deficiency, unspecified: Secondary | ICD-10-CM | POA: Diagnosis not present

## 2023-01-22 DIAGNOSIS — E1169 Type 2 diabetes mellitus with other specified complication: Secondary | ICD-10-CM | POA: Diagnosis not present

## 2023-01-22 DIAGNOSIS — F331 Major depressive disorder, recurrent, moderate: Secondary | ICD-10-CM | POA: Diagnosis not present

## 2023-01-22 MED ORDER — CLONAZEPAM 1 MG PO TABS
1.0000 mg | ORAL_TABLET | Freq: Two times a day (BID) | ORAL | 3 refills | Status: DC | PRN
  Filled 2023-01-22: qty 60, 30d supply, fill #0
  Filled 2023-04-08: qty 60, 30d supply, fill #1
  Filled 2023-05-30: qty 60, 30d supply, fill #2
  Filled 2023-06-30: qty 60, 30d supply, fill #3

## 2023-01-22 MED ORDER — JARDIANCE 10 MG PO TABS
10.0000 mg | ORAL_TABLET | Freq: Every day | ORAL | 1 refills | Status: AC
  Filled 2023-01-22 – 2023-01-29 (×3): qty 90, 90d supply, fill #0

## 2023-01-23 ENCOUNTER — Encounter: Payer: Self-pay | Admitting: Pharmacist

## 2023-01-23 ENCOUNTER — Other Ambulatory Visit: Payer: Self-pay

## 2023-01-27 DIAGNOSIS — M6281 Muscle weakness (generalized): Secondary | ICD-10-CM | POA: Diagnosis not present

## 2023-01-27 DIAGNOSIS — R2689 Other abnormalities of gait and mobility: Secondary | ICD-10-CM | POA: Diagnosis not present

## 2023-01-29 ENCOUNTER — Other Ambulatory Visit (HOSPITAL_COMMUNITY): Payer: Self-pay

## 2023-01-30 DIAGNOSIS — M6281 Muscle weakness (generalized): Secondary | ICD-10-CM | POA: Diagnosis not present

## 2023-01-30 DIAGNOSIS — R2689 Other abnormalities of gait and mobility: Secondary | ICD-10-CM | POA: Diagnosis not present

## 2023-02-04 DIAGNOSIS — M6281 Muscle weakness (generalized): Secondary | ICD-10-CM | POA: Diagnosis not present

## 2023-02-04 DIAGNOSIS — R2689 Other abnormalities of gait and mobility: Secondary | ICD-10-CM | POA: Diagnosis not present

## 2023-02-06 DIAGNOSIS — R2689 Other abnormalities of gait and mobility: Secondary | ICD-10-CM | POA: Diagnosis not present

## 2023-02-06 DIAGNOSIS — M6281 Muscle weakness (generalized): Secondary | ICD-10-CM | POA: Diagnosis not present

## 2023-02-13 ENCOUNTER — Other Ambulatory Visit (HOSPITAL_COMMUNITY): Payer: Self-pay

## 2023-02-13 DIAGNOSIS — R2689 Other abnormalities of gait and mobility: Secondary | ICD-10-CM | POA: Diagnosis not present

## 2023-02-13 DIAGNOSIS — M6281 Muscle weakness (generalized): Secondary | ICD-10-CM | POA: Diagnosis not present

## 2023-02-14 ENCOUNTER — Other Ambulatory Visit (HOSPITAL_COMMUNITY): Payer: Self-pay

## 2023-02-14 ENCOUNTER — Encounter: Payer: Self-pay | Admitting: Pharmacist

## 2023-02-14 ENCOUNTER — Other Ambulatory Visit: Payer: Self-pay

## 2023-02-14 MED ORDER — TIZANIDINE HCL 2 MG PO TABS
2.0000 mg | ORAL_TABLET | Freq: Every day | ORAL | 1 refills | Status: AC | PRN
  Filled 2023-02-14: qty 30, 30d supply, fill #0
  Filled 2023-03-18: qty 30, 30d supply, fill #1

## 2023-02-14 MED ORDER — OXYCODONE HCL 10 MG PO TABS
10.0000 mg | ORAL_TABLET | ORAL | 0 refills | Status: AC | PRN
  Filled 2023-03-27: qty 180, 30d supply, fill #0

## 2023-02-14 MED ORDER — BUTALBITAL-ACETAMINOPHEN 50-325 MG PO TABS
1.0000 | ORAL_TABLET | Freq: Two times a day (BID) | ORAL | 1 refills | Status: DC | PRN
Start: 2023-02-13 — End: 2023-12-24
  Filled 2023-02-14: qty 30, 15d supply, fill #0
  Filled 2023-06-30: qty 30, 15d supply, fill #1

## 2023-02-14 MED ORDER — OXYCODONE HCL 10 MG PO TABS
10.0000 mg | ORAL_TABLET | ORAL | 0 refills | Status: AC | PRN
  Filled 2023-02-14: qty 180, 30d supply, fill #0

## 2023-02-17 ENCOUNTER — Other Ambulatory Visit (HOSPITAL_COMMUNITY): Payer: Self-pay

## 2023-02-18 DIAGNOSIS — Z23 Encounter for immunization: Secondary | ICD-10-CM | POA: Diagnosis not present

## 2023-02-20 DIAGNOSIS — M6281 Muscle weakness (generalized): Secondary | ICD-10-CM | POA: Diagnosis not present

## 2023-02-20 DIAGNOSIS — R2689 Other abnormalities of gait and mobility: Secondary | ICD-10-CM | POA: Diagnosis not present

## 2023-02-25 DIAGNOSIS — R2689 Other abnormalities of gait and mobility: Secondary | ICD-10-CM | POA: Diagnosis not present

## 2023-02-25 DIAGNOSIS — M6281 Muscle weakness (generalized): Secondary | ICD-10-CM | POA: Diagnosis not present

## 2023-02-27 DIAGNOSIS — D140 Benign neoplasm of middle ear, nasal cavity and accessory sinuses: Secondary | ICD-10-CM | POA: Diagnosis not present

## 2023-02-27 DIAGNOSIS — Z9889 Other specified postprocedural states: Secondary | ICD-10-CM | POA: Diagnosis not present

## 2023-02-27 DIAGNOSIS — D119 Benign neoplasm of major salivary gland, unspecified: Secondary | ICD-10-CM | POA: Diagnosis not present

## 2023-03-04 DIAGNOSIS — R2689 Other abnormalities of gait and mobility: Secondary | ICD-10-CM | POA: Diagnosis not present

## 2023-03-04 DIAGNOSIS — M6281 Muscle weakness (generalized): Secondary | ICD-10-CM | POA: Diagnosis not present

## 2023-03-06 DIAGNOSIS — R2689 Other abnormalities of gait and mobility: Secondary | ICD-10-CM | POA: Diagnosis not present

## 2023-03-06 DIAGNOSIS — M6281 Muscle weakness (generalized): Secondary | ICD-10-CM | POA: Diagnosis not present

## 2023-03-13 DIAGNOSIS — R2689 Other abnormalities of gait and mobility: Secondary | ICD-10-CM | POA: Diagnosis not present

## 2023-03-13 DIAGNOSIS — M6281 Muscle weakness (generalized): Secondary | ICD-10-CM | POA: Diagnosis not present

## 2023-03-17 DIAGNOSIS — M6281 Muscle weakness (generalized): Secondary | ICD-10-CM | POA: Diagnosis not present

## 2023-03-17 DIAGNOSIS — R2689 Other abnormalities of gait and mobility: Secondary | ICD-10-CM | POA: Diagnosis not present

## 2023-03-18 ENCOUNTER — Other Ambulatory Visit (HOSPITAL_COMMUNITY): Payer: Self-pay

## 2023-03-19 ENCOUNTER — Other Ambulatory Visit (HOSPITAL_COMMUNITY): Payer: Self-pay

## 2023-03-19 DIAGNOSIS — M6281 Muscle weakness (generalized): Secondary | ICD-10-CM | POA: Diagnosis not present

## 2023-03-19 DIAGNOSIS — R2689 Other abnormalities of gait and mobility: Secondary | ICD-10-CM | POA: Diagnosis not present

## 2023-03-27 ENCOUNTER — Other Ambulatory Visit (HOSPITAL_COMMUNITY): Payer: Self-pay

## 2023-03-27 DIAGNOSIS — M6281 Muscle weakness (generalized): Secondary | ICD-10-CM | POA: Diagnosis not present

## 2023-03-27 DIAGNOSIS — R2689 Other abnormalities of gait and mobility: Secondary | ICD-10-CM | POA: Diagnosis not present

## 2023-03-28 ENCOUNTER — Other Ambulatory Visit (HOSPITAL_COMMUNITY): Payer: Self-pay

## 2023-04-02 DIAGNOSIS — M6281 Muscle weakness (generalized): Secondary | ICD-10-CM | POA: Diagnosis not present

## 2023-04-02 DIAGNOSIS — R2689 Other abnormalities of gait and mobility: Secondary | ICD-10-CM | POA: Diagnosis not present

## 2023-04-08 ENCOUNTER — Other Ambulatory Visit (HOSPITAL_COMMUNITY): Payer: Self-pay

## 2023-04-09 ENCOUNTER — Other Ambulatory Visit: Payer: Self-pay

## 2023-04-16 DIAGNOSIS — M6281 Muscle weakness (generalized): Secondary | ICD-10-CM | POA: Diagnosis not present

## 2023-04-16 DIAGNOSIS — E278 Other specified disorders of adrenal gland: Secondary | ICD-10-CM | POA: Diagnosis not present

## 2023-04-16 DIAGNOSIS — R911 Solitary pulmonary nodule: Secondary | ICD-10-CM | POA: Diagnosis not present

## 2023-04-16 DIAGNOSIS — G4733 Obstructive sleep apnea (adult) (pediatric): Secondary | ICD-10-CM | POA: Diagnosis not present

## 2023-04-16 DIAGNOSIS — F419 Anxiety disorder, unspecified: Secondary | ICD-10-CM | POA: Diagnosis not present

## 2023-04-16 DIAGNOSIS — R2689 Other abnormalities of gait and mobility: Secondary | ICD-10-CM | POA: Diagnosis not present

## 2023-04-16 DIAGNOSIS — E559 Vitamin D deficiency, unspecified: Secondary | ICD-10-CM | POA: Diagnosis not present

## 2023-04-16 DIAGNOSIS — F331 Major depressive disorder, recurrent, moderate: Secondary | ICD-10-CM | POA: Diagnosis not present

## 2023-04-16 DIAGNOSIS — I10 Essential (primary) hypertension: Secondary | ICD-10-CM | POA: Diagnosis not present

## 2023-04-16 DIAGNOSIS — D119 Benign neoplasm of major salivary gland, unspecified: Secondary | ICD-10-CM | POA: Diagnosis not present

## 2023-04-16 DIAGNOSIS — E1169 Type 2 diabetes mellitus with other specified complication: Secondary | ICD-10-CM | POA: Diagnosis not present

## 2023-04-17 ENCOUNTER — Other Ambulatory Visit: Payer: Self-pay

## 2023-04-17 ENCOUNTER — Other Ambulatory Visit (HOSPITAL_COMMUNITY): Payer: Self-pay

## 2023-04-17 DIAGNOSIS — Z79891 Long term (current) use of opiate analgesic: Secondary | ICD-10-CM | POA: Diagnosis not present

## 2023-04-17 DIAGNOSIS — G894 Chronic pain syndrome: Secondary | ICD-10-CM | POA: Diagnosis not present

## 2023-04-17 MED ORDER — OXYCODONE HCL 10 MG PO TABS
10.0000 mg | ORAL_TABLET | ORAL | 0 refills | Status: DC | PRN
  Filled 2023-05-30: qty 180, 30d supply, fill #0

## 2023-04-17 MED ORDER — OXYCODONE HCL 10 MG PO TABS
10.0000 mg | ORAL_TABLET | ORAL | 0 refills | Status: AC | PRN
  Filled 2023-04-17 – 2023-04-24 (×2): qty 180, 30d supply, fill #0

## 2023-04-17 MED ORDER — TIZANIDINE HCL 2 MG PO TABS
2.0000 mg | ORAL_TABLET | Freq: Two times a day (BID) | ORAL | 1 refills | Status: AC | PRN
  Filled 2023-04-17 (×2): qty 60, 30d supply, fill #0
  Filled 2023-05-30: qty 60, 30d supply, fill #1

## 2023-04-23 DIAGNOSIS — M6281 Muscle weakness (generalized): Secondary | ICD-10-CM | POA: Diagnosis not present

## 2023-04-23 DIAGNOSIS — R2689 Other abnormalities of gait and mobility: Secondary | ICD-10-CM | POA: Diagnosis not present

## 2023-04-24 ENCOUNTER — Other Ambulatory Visit: Payer: Self-pay

## 2023-04-24 ENCOUNTER — Other Ambulatory Visit (HOSPITAL_COMMUNITY): Payer: Self-pay

## 2023-04-28 ENCOUNTER — Other Ambulatory Visit (HOSPITAL_BASED_OUTPATIENT_CLINIC_OR_DEPARTMENT_OTHER): Payer: Self-pay

## 2023-04-28 ENCOUNTER — Other Ambulatory Visit: Payer: Self-pay

## 2023-04-28 ENCOUNTER — Other Ambulatory Visit (HOSPITAL_COMMUNITY): Payer: Self-pay

## 2023-04-29 ENCOUNTER — Other Ambulatory Visit (HOSPITAL_COMMUNITY): Payer: Self-pay

## 2023-04-29 ENCOUNTER — Other Ambulatory Visit: Payer: Self-pay

## 2023-04-30 ENCOUNTER — Other Ambulatory Visit (HOSPITAL_COMMUNITY): Payer: Self-pay

## 2023-04-30 ENCOUNTER — Other Ambulatory Visit: Payer: Self-pay

## 2023-05-30 ENCOUNTER — Other Ambulatory Visit (HOSPITAL_COMMUNITY): Payer: Self-pay

## 2023-06-02 ENCOUNTER — Other Ambulatory Visit: Payer: Self-pay

## 2023-06-18 ENCOUNTER — Other Ambulatory Visit (HOSPITAL_COMMUNITY): Payer: Self-pay

## 2023-06-18 MED ORDER — OXYCODONE HCL 10 MG PO TABS
10.0000 mg | ORAL_TABLET | ORAL | 0 refills | Status: AC | PRN
  Filled 2023-06-30: qty 180, 30d supply, fill #0

## 2023-06-18 MED ORDER — OXYCODONE HCL 10 MG PO TABS
10.0000 mg | ORAL_TABLET | ORAL | 0 refills | Status: AC | PRN
  Filled 2023-08-04: qty 180, 30d supply, fill #0

## 2023-06-18 MED ORDER — TIZANIDINE HCL 2 MG PO TABS
2.0000 mg | ORAL_TABLET | Freq: Two times a day (BID) | ORAL | 1 refills | Status: DC | PRN
  Filled 2023-06-30: qty 60, 30d supply, fill #0
  Filled 2023-08-04: qty 60, 30d supply, fill #1

## 2023-06-19 ENCOUNTER — Other Ambulatory Visit (HOSPITAL_COMMUNITY): Payer: Self-pay

## 2023-06-30 ENCOUNTER — Other Ambulatory Visit (HOSPITAL_COMMUNITY): Payer: Self-pay

## 2023-07-03 ENCOUNTER — Other Ambulatory Visit (HOSPITAL_COMMUNITY): Payer: Self-pay

## 2023-07-16 DIAGNOSIS — Z23 Encounter for immunization: Secondary | ICD-10-CM | POA: Diagnosis not present

## 2023-07-16 DIAGNOSIS — E278 Other specified disorders of adrenal gland: Secondary | ICD-10-CM | POA: Diagnosis not present

## 2023-07-16 DIAGNOSIS — F419 Anxiety disorder, unspecified: Secondary | ICD-10-CM | POA: Diagnosis not present

## 2023-07-16 DIAGNOSIS — R911 Solitary pulmonary nodule: Secondary | ICD-10-CM | POA: Diagnosis not present

## 2023-07-16 DIAGNOSIS — Z713 Dietary counseling and surveillance: Secondary | ICD-10-CM | POA: Diagnosis not present

## 2023-07-16 DIAGNOSIS — I10 Essential (primary) hypertension: Secondary | ICD-10-CM | POA: Diagnosis not present

## 2023-07-16 DIAGNOSIS — D119 Benign neoplasm of major salivary gland, unspecified: Secondary | ICD-10-CM | POA: Diagnosis not present

## 2023-07-16 DIAGNOSIS — G4733 Obstructive sleep apnea (adult) (pediatric): Secondary | ICD-10-CM | POA: Diagnosis not present

## 2023-07-16 DIAGNOSIS — F331 Major depressive disorder, recurrent, moderate: Secondary | ICD-10-CM | POA: Diagnosis not present

## 2023-07-16 DIAGNOSIS — E1169 Type 2 diabetes mellitus with other specified complication: Secondary | ICD-10-CM | POA: Diagnosis not present

## 2023-07-16 DIAGNOSIS — Z6831 Body mass index (BMI) 31.0-31.9, adult: Secondary | ICD-10-CM | POA: Diagnosis not present

## 2023-08-04 ENCOUNTER — Other Ambulatory Visit: Payer: Self-pay

## 2023-08-04 ENCOUNTER — Other Ambulatory Visit (HOSPITAL_COMMUNITY): Payer: Self-pay

## 2023-08-05 ENCOUNTER — Other Ambulatory Visit (HOSPITAL_COMMUNITY): Payer: Self-pay

## 2023-08-05 MED ORDER — CLONAZEPAM 1 MG PO TABS
1.0000 mg | ORAL_TABLET | Freq: Two times a day (BID) | ORAL | 3 refills | Status: AC | PRN
  Filled 2023-08-05: qty 60, 30d supply, fill #0
  Filled 2023-09-13: qty 60, 30d supply, fill #1
  Filled 2023-11-24: qty 60, 30d supply, fill #2
  Filled 2024-01-20: qty 60, 30d supply, fill #3

## 2023-08-06 ENCOUNTER — Other Ambulatory Visit: Payer: Self-pay

## 2023-08-06 ENCOUNTER — Other Ambulatory Visit (HOSPITAL_COMMUNITY): Payer: Self-pay

## 2023-08-14 ENCOUNTER — Other Ambulatory Visit (HOSPITAL_COMMUNITY): Payer: Self-pay

## 2023-08-14 ENCOUNTER — Other Ambulatory Visit: Payer: Self-pay

## 2023-08-14 MED ORDER — TIZANIDINE HCL 2 MG PO TABS
2.0000 mg | ORAL_TABLET | Freq: Two times a day (BID) | ORAL | 1 refills | Status: AC | PRN
  Filled 2023-09-13: qty 60, 30d supply, fill #0
  Filled 2023-12-24 (×3): qty 60, 30d supply, fill #1

## 2023-08-14 MED ORDER — OXYCODONE HCL 10 MG PO TABS
10.0000 mg | ORAL_TABLET | ORAL | 0 refills | Status: AC | PRN
  Filled 2023-09-13: qty 180, 30d supply, fill #0

## 2023-08-14 MED ORDER — OXYCODONE HCL 10 MG PO TABS
10.0000 mg | ORAL_TABLET | ORAL | 0 refills | Status: AC | PRN
  Filled 2023-10-24 – 2023-10-27 (×2): qty 180, 30d supply, fill #0

## 2023-08-14 MED ORDER — VENLAFAXINE HCL ER 75 MG PO CP24
75.0000 mg | ORAL_CAPSULE | Freq: Every day | ORAL | 0 refills | Status: DC
  Filled 2023-08-14: qty 30, 30d supply, fill #0

## 2023-09-13 ENCOUNTER — Other Ambulatory Visit (HOSPITAL_COMMUNITY): Payer: Self-pay

## 2023-09-15 ENCOUNTER — Other Ambulatory Visit: Payer: Self-pay

## 2023-09-16 ENCOUNTER — Other Ambulatory Visit (HOSPITAL_COMMUNITY): Payer: Self-pay

## 2023-09-17 ENCOUNTER — Other Ambulatory Visit (HOSPITAL_COMMUNITY): Payer: Self-pay

## 2023-09-18 ENCOUNTER — Other Ambulatory Visit (HOSPITAL_COMMUNITY): Payer: Self-pay

## 2023-09-19 ENCOUNTER — Other Ambulatory Visit (HOSPITAL_COMMUNITY): Payer: Self-pay

## 2023-10-07 DIAGNOSIS — I10 Essential (primary) hypertension: Secondary | ICD-10-CM | POA: Diagnosis not present

## 2023-10-07 DIAGNOSIS — I2510 Atherosclerotic heart disease of native coronary artery without angina pectoris: Secondary | ICD-10-CM | POA: Diagnosis not present

## 2023-10-07 DIAGNOSIS — Z79899 Other long term (current) drug therapy: Secondary | ICD-10-CM | POA: Diagnosis not present

## 2023-10-07 DIAGNOSIS — E291 Testicular hypofunction: Secondary | ICD-10-CM | POA: Diagnosis not present

## 2023-10-07 DIAGNOSIS — E559 Vitamin D deficiency, unspecified: Secondary | ICD-10-CM | POA: Diagnosis not present

## 2023-10-07 DIAGNOSIS — E785 Hyperlipidemia, unspecified: Secondary | ICD-10-CM | POA: Diagnosis not present

## 2023-10-07 DIAGNOSIS — E1169 Type 2 diabetes mellitus with other specified complication: Secondary | ICD-10-CM | POA: Diagnosis not present

## 2023-10-07 DIAGNOSIS — Z125 Encounter for screening for malignant neoplasm of prostate: Secondary | ICD-10-CM | POA: Diagnosis not present

## 2023-10-13 ENCOUNTER — Other Ambulatory Visit: Payer: Self-pay

## 2023-10-13 ENCOUNTER — Other Ambulatory Visit (HOSPITAL_COMMUNITY): Payer: Self-pay

## 2023-10-13 MED ORDER — OXYCODONE HCL 10 MG PO TABS
10.0000 mg | ORAL_TABLET | ORAL | 0 refills | Status: AC | PRN
  Filled 2023-10-13 – 2023-11-24 (×2): qty 180, 30d supply, fill #0

## 2023-10-13 MED ORDER — OXYCODONE HCL 10 MG PO TABS: 10.0000 mg | ORAL_TABLET | ORAL | 0 refills | Status: AC | PRN

## 2023-10-13 MED ORDER — TIZANIDINE HCL 2 MG PO TABS
2.0000 mg | ORAL_TABLET | Freq: Two times a day (BID) | ORAL | 1 refills | Status: AC | PRN
  Filled 2023-10-13: qty 60, 30d supply, fill #0
  Filled 2023-11-18: qty 60, 30d supply, fill #1

## 2023-10-13 MED ORDER — VENLAFAXINE HCL ER 75 MG PO CP24
75.0000 mg | ORAL_CAPSULE | Freq: Every day | ORAL | 1 refills | Status: DC
  Filled 2023-10-13: qty 30, 30d supply, fill #0
  Filled 2023-11-24: qty 30, 30d supply, fill #1

## 2023-10-14 ENCOUNTER — Other Ambulatory Visit (HOSPITAL_COMMUNITY): Payer: Self-pay

## 2023-10-14 ENCOUNTER — Other Ambulatory Visit: Payer: Self-pay

## 2023-10-14 DIAGNOSIS — Z0000 Encounter for general adult medical examination without abnormal findings: Secondary | ICD-10-CM | POA: Diagnosis not present

## 2023-10-14 DIAGNOSIS — E1169 Type 2 diabetes mellitus with other specified complication: Secondary | ICD-10-CM | POA: Diagnosis not present

## 2023-10-14 DIAGNOSIS — F331 Major depressive disorder, recurrent, moderate: Secondary | ICD-10-CM | POA: Diagnosis not present

## 2023-10-14 DIAGNOSIS — Z1339 Encounter for screening examination for other mental health and behavioral disorders: Secondary | ICD-10-CM | POA: Diagnosis not present

## 2023-10-14 DIAGNOSIS — G4733 Obstructive sleep apnea (adult) (pediatric): Secondary | ICD-10-CM | POA: Diagnosis not present

## 2023-10-14 DIAGNOSIS — R911 Solitary pulmonary nodule: Secondary | ICD-10-CM | POA: Diagnosis not present

## 2023-10-14 DIAGNOSIS — I10 Essential (primary) hypertension: Secondary | ICD-10-CM | POA: Diagnosis not present

## 2023-10-14 DIAGNOSIS — E559 Vitamin D deficiency, unspecified: Secondary | ICD-10-CM | POA: Diagnosis not present

## 2023-10-14 DIAGNOSIS — F419 Anxiety disorder, unspecified: Secondary | ICD-10-CM | POA: Diagnosis not present

## 2023-10-14 DIAGNOSIS — D119 Benign neoplasm of major salivary gland, unspecified: Secondary | ICD-10-CM | POA: Diagnosis not present

## 2023-10-14 DIAGNOSIS — Z1331 Encounter for screening for depression: Secondary | ICD-10-CM | POA: Diagnosis not present

## 2023-10-24 ENCOUNTER — Other Ambulatory Visit (HOSPITAL_COMMUNITY): Payer: Self-pay

## 2023-10-27 ENCOUNTER — Other Ambulatory Visit: Payer: Self-pay

## 2023-10-27 ENCOUNTER — Other Ambulatory Visit (HOSPITAL_COMMUNITY): Payer: Self-pay

## 2023-11-18 ENCOUNTER — Other Ambulatory Visit (HOSPITAL_COMMUNITY): Payer: Self-pay

## 2023-11-24 ENCOUNTER — Other Ambulatory Visit (HOSPITAL_COMMUNITY): Payer: Self-pay

## 2023-11-25 ENCOUNTER — Other Ambulatory Visit (HOSPITAL_COMMUNITY): Payer: Self-pay

## 2023-12-08 ENCOUNTER — Other Ambulatory Visit (HOSPITAL_COMMUNITY): Payer: Self-pay

## 2023-12-08 MED ORDER — TIZANIDINE HCL 2 MG PO TABS
2.0000 mg | ORAL_TABLET | Freq: Two times a day (BID) | ORAL | 1 refills | Status: AC | PRN
  Filled 2024-01-20: qty 60, 30d supply, fill #0

## 2023-12-08 MED ORDER — OXYCODONE HCL 10 MG PO TABS
10.0000 mg | ORAL_TABLET | ORAL | 0 refills | Status: AC | PRN
  Filled 2023-12-31 (×2): qty 180, 30d supply, fill #0

## 2023-12-08 MED ORDER — VENLAFAXINE HCL ER 75 MG PO CP24
75.0000 mg | ORAL_CAPSULE | Freq: Every day | ORAL | 1 refills | Status: AC
  Filled 2024-02-03: qty 30, 30d supply, fill #0
  Filled 2024-03-08: qty 30, 30d supply, fill #1

## 2023-12-08 MED ORDER — OXYCODONE HCL 10 MG PO TABS
10.0000 mg | ORAL_TABLET | ORAL | 0 refills | Status: AC | PRN
  Filled 2024-02-02: qty 180, 30d supply, fill #0

## 2023-12-22 ENCOUNTER — Other Ambulatory Visit (HOSPITAL_COMMUNITY): Payer: Self-pay

## 2023-12-24 ENCOUNTER — Other Ambulatory Visit (HOSPITAL_COMMUNITY): Payer: Self-pay

## 2023-12-24 ENCOUNTER — Other Ambulatory Visit: Payer: Self-pay

## 2023-12-24 MED ORDER — BUTALBITAL-ACETAMINOPHEN 50-325 MG PO TABS
1.0000 | ORAL_TABLET | Freq: Two times a day (BID) | ORAL | 1 refills | Status: AC | PRN
  Filled 2023-12-24 (×2): qty 30, 15d supply, fill #0

## 2023-12-25 ENCOUNTER — Other Ambulatory Visit (HOSPITAL_COMMUNITY): Payer: Self-pay

## 2023-12-31 ENCOUNTER — Other Ambulatory Visit (HOSPITAL_COMMUNITY): Payer: Self-pay

## 2024-01-01 ENCOUNTER — Other Ambulatory Visit: Payer: Self-pay

## 2024-01-20 ENCOUNTER — Other Ambulatory Visit (HOSPITAL_COMMUNITY): Payer: Self-pay

## 2024-02-02 ENCOUNTER — Other Ambulatory Visit (HOSPITAL_COMMUNITY): Payer: Self-pay

## 2024-02-03 ENCOUNTER — Other Ambulatory Visit (HOSPITAL_COMMUNITY): Payer: Self-pay

## 2024-02-03 MED ORDER — OXYCODONE HCL 10 MG PO TABS
10.0000 mg | ORAL_TABLET | ORAL | 0 refills | Status: AC | PRN
  Filled 2024-02-03 – 2024-03-08 (×2): qty 180, 30d supply, fill #0

## 2024-02-03 MED ORDER — TIZANIDINE HCL 2 MG PO TABS
2.0000 mg | ORAL_TABLET | Freq: Two times a day (BID) | ORAL | 1 refills | Status: AC | PRN
  Filled 2024-02-03 – 2024-02-23 (×4): qty 60, 30d supply, fill #0
  Filled 2024-03-31: qty 60, 30d supply, fill #1

## 2024-02-03 MED ORDER — VENLAFAXINE HCL ER 75 MG PO CP24
75.0000 mg | ORAL_CAPSULE | Freq: Every day | ORAL | 1 refills | Status: AC
  Filled 2024-02-03: qty 30, 30d supply, fill #0

## 2024-02-03 MED ORDER — OXYCODONE HCL 10 MG PO TABS
10.0000 mg | ORAL_TABLET | ORAL | 0 refills | Status: AC | PRN
  Filled 2024-04-12: qty 180, 30d supply, fill #0

## 2024-02-13 ENCOUNTER — Other Ambulatory Visit (HOSPITAL_COMMUNITY): Payer: Self-pay

## 2024-02-23 ENCOUNTER — Other Ambulatory Visit (HOSPITAL_COMMUNITY): Payer: Self-pay

## 2024-02-23 ENCOUNTER — Other Ambulatory Visit: Payer: Self-pay

## 2024-02-24 ENCOUNTER — Other Ambulatory Visit (HOSPITAL_COMMUNITY): Payer: Self-pay

## 2024-02-24 ENCOUNTER — Other Ambulatory Visit: Payer: Self-pay

## 2024-02-27 DIAGNOSIS — F331 Major depressive disorder, recurrent, moderate: Secondary | ICD-10-CM | POA: Diagnosis not present

## 2024-02-27 DIAGNOSIS — F419 Anxiety disorder, unspecified: Secondary | ICD-10-CM | POA: Diagnosis not present

## 2024-02-27 DIAGNOSIS — D119 Benign neoplasm of major salivary gland, unspecified: Secondary | ICD-10-CM | POA: Diagnosis not present

## 2024-02-27 DIAGNOSIS — G4733 Obstructive sleep apnea (adult) (pediatric): Secondary | ICD-10-CM | POA: Diagnosis not present

## 2024-02-27 DIAGNOSIS — R911 Solitary pulmonary nodule: Secondary | ICD-10-CM | POA: Diagnosis not present

## 2024-02-27 DIAGNOSIS — E1169 Type 2 diabetes mellitus with other specified complication: Secondary | ICD-10-CM | POA: Diagnosis not present

## 2024-02-27 DIAGNOSIS — Z23 Encounter for immunization: Secondary | ICD-10-CM | POA: Diagnosis not present

## 2024-02-27 DIAGNOSIS — I10 Essential (primary) hypertension: Secondary | ICD-10-CM | POA: Diagnosis not present

## 2024-02-27 DIAGNOSIS — E278 Other specified disorders of adrenal gland: Secondary | ICD-10-CM | POA: Diagnosis not present

## 2024-02-27 DIAGNOSIS — E559 Vitamin D deficiency, unspecified: Secondary | ICD-10-CM | POA: Diagnosis not present

## 2024-03-08 ENCOUNTER — Other Ambulatory Visit: Payer: Self-pay

## 2024-03-08 ENCOUNTER — Other Ambulatory Visit (HOSPITAL_COMMUNITY): Payer: Self-pay

## 2024-03-11 ENCOUNTER — Other Ambulatory Visit (HOSPITAL_COMMUNITY): Payer: Self-pay

## 2024-03-18 ENCOUNTER — Other Ambulatory Visit (HOSPITAL_COMMUNITY): Payer: Self-pay

## 2024-03-31 ENCOUNTER — Other Ambulatory Visit (HOSPITAL_COMMUNITY): Payer: Self-pay

## 2024-04-01 ENCOUNTER — Other Ambulatory Visit: Payer: Self-pay

## 2024-04-01 ENCOUNTER — Other Ambulatory Visit (HOSPITAL_COMMUNITY): Payer: Self-pay

## 2024-04-01 MED ORDER — TIZANIDINE HCL 2 MG PO TABS
2.0000 mg | ORAL_TABLET | Freq: Two times a day (BID) | ORAL | 1 refills | Status: AC | PRN
  Filled 2024-04-01 – 2024-04-30 (×2): qty 60, 30d supply, fill #0

## 2024-04-01 MED ORDER — BUTALBITAL-ACETAMINOPHEN 50-325 MG PO TABS
1.0000 | ORAL_TABLET | Freq: Two times a day (BID) | ORAL | 1 refills | Status: AC | PRN
  Filled 2024-04-01: qty 30, 15d supply, fill #0

## 2024-04-01 MED ORDER — OXYCODONE HCL 10 MG PO TABS
10.0000 mg | ORAL_TABLET | ORAL | 0 refills | Status: AC | PRN
  Filled 2024-04-01 – 2024-06-15 (×2): qty 180, 30d supply, fill #0

## 2024-04-01 MED ORDER — OXYCODONE HCL 10 MG PO TABS
10.0000 mg | ORAL_TABLET | ORAL | 0 refills | Status: AC | PRN
  Filled 2024-05-03 – 2024-05-11 (×2): qty 180, 30d supply, fill #0

## 2024-04-01 MED ORDER — VENLAFAXINE HCL ER 75 MG PO CP24
75.0000 mg | ORAL_CAPSULE | Freq: Every day | ORAL | 1 refills | Status: AC
  Filled 2024-04-01: qty 30, 30d supply, fill #0
  Filled 2024-05-10: qty 30, 30d supply, fill #1

## 2024-04-05 ENCOUNTER — Other Ambulatory Visit (HOSPITAL_COMMUNITY): Payer: Self-pay

## 2024-04-07 ENCOUNTER — Other Ambulatory Visit (HOSPITAL_COMMUNITY): Payer: Self-pay

## 2024-04-09 ENCOUNTER — Other Ambulatory Visit (HOSPITAL_COMMUNITY): Payer: Self-pay

## 2024-04-12 ENCOUNTER — Other Ambulatory Visit (HOSPITAL_COMMUNITY): Payer: Self-pay

## 2024-04-13 ENCOUNTER — Other Ambulatory Visit (HOSPITAL_COMMUNITY): Payer: Self-pay

## 2024-04-30 ENCOUNTER — Other Ambulatory Visit (HOSPITAL_COMMUNITY): Payer: Self-pay

## 2024-05-03 ENCOUNTER — Other Ambulatory Visit (HOSPITAL_COMMUNITY): Payer: Self-pay

## 2024-05-10 ENCOUNTER — Other Ambulatory Visit (HOSPITAL_COMMUNITY): Payer: Self-pay

## 2024-05-11 ENCOUNTER — Other Ambulatory Visit (HOSPITAL_COMMUNITY): Payer: Self-pay

## 2024-05-11 ENCOUNTER — Other Ambulatory Visit: Payer: Self-pay

## 2024-05-31 ENCOUNTER — Other Ambulatory Visit (HOSPITAL_COMMUNITY): Payer: Self-pay

## 2024-05-31 MED ORDER — OXYCODONE HCL 10 MG PO TABS: 10.0000 mg | ORAL_TABLET | ORAL | 0 refills | Status: AC | PRN

## 2024-05-31 MED ORDER — TIZANIDINE HCL 2 MG PO TABS
2.0000 mg | ORAL_TABLET | Freq: Two times a day (BID) | ORAL | 1 refills | Status: AC | PRN
  Filled 2024-05-31: qty 60, 30d supply, fill #0

## 2024-05-31 MED ORDER — VENLAFAXINE HCL ER 150 MG PO CP24
150.0000 mg | ORAL_CAPSULE | Freq: Every day | ORAL | 1 refills | Status: AC
  Filled 2024-05-31: qty 30, 30d supply, fill #0

## 2024-06-09 ENCOUNTER — Other Ambulatory Visit (HOSPITAL_COMMUNITY): Payer: Self-pay

## 2024-06-15 ENCOUNTER — Other Ambulatory Visit (HOSPITAL_COMMUNITY): Payer: Self-pay

## 2024-06-16 ENCOUNTER — Other Ambulatory Visit (HOSPITAL_COMMUNITY): Payer: Self-pay

## 2024-06-16 MED ORDER — CLONAZEPAM 1 MG PO TABS
1.0000 mg | ORAL_TABLET | Freq: Two times a day (BID) | ORAL | 3 refills | Status: AC | PRN
  Filled 2024-06-16: qty 60, 30d supply, fill #0
